# Patient Record
Sex: Male | Born: 1935 | Race: Black or African American | Hispanic: No | Marital: Single | Smoking: Former smoker
Health system: Southern US, Community
[De-identification: ages and names within clinical notes are randomized; demographics above are authoritative.]

## PROBLEM LIST (undated history)

## (undated) DIAGNOSIS — I739 Peripheral vascular disease, unspecified: Secondary | ICD-10-CM

## (undated) DIAGNOSIS — I509 Heart failure, unspecified: Secondary | ICD-10-CM

## (undated) DIAGNOSIS — I82409 Acute embolism and thrombosis of unspecified deep veins of unspecified lower extremity: Secondary | ICD-10-CM

## (undated) DIAGNOSIS — N390 Urinary tract infection, site not specified: Secondary | ICD-10-CM

## (undated) DIAGNOSIS — I4891 Unspecified atrial fibrillation: Secondary | ICD-10-CM

## (undated) DIAGNOSIS — D72829 Elevated white blood cell count, unspecified: Secondary | ICD-10-CM

## (undated) DIAGNOSIS — I10 Essential (primary) hypertension: Secondary | ICD-10-CM

## (undated) DIAGNOSIS — E119 Type 2 diabetes mellitus without complications: Secondary | ICD-10-CM

## (undated) DIAGNOSIS — C801 Malignant (primary) neoplasm, unspecified: Secondary | ICD-10-CM

## (undated) DIAGNOSIS — N400 Enlarged prostate without lower urinary tract symptoms: Secondary | ICD-10-CM

## (undated) HISTORY — DX: Peripheral vascular disease, unspecified: I73.9

## (undated) HISTORY — PX: LEG AMPUTATION: SHX1105

## (undated) HISTORY — DX: Acute embolism and thrombosis of unspecified deep veins of unspecified lower extremity: I82.409

## (undated) HISTORY — PX: CARDIAC SURGERY: SHX584

## (undated) HISTORY — PX: PR VEIN BYPASS GRAFT,AORTO-FEM-POP: 35551

## (undated) HISTORY — PX: CATARACT EXTRACTION: SUR2

## (undated) HISTORY — DX: Heart failure, unspecified: I50.9

---

## 1998-06-23 ENCOUNTER — Inpatient Hospital Stay (HOSPITAL_COMMUNITY): Admission: AD | Admit: 1998-06-23 | Discharge: 1998-06-30 | Payer: Self-pay | Admitting: Family Medicine

## 2002-07-17 ENCOUNTER — Inpatient Hospital Stay (HOSPITAL_COMMUNITY): Admission: EM | Admit: 2002-07-17 | Discharge: 2002-07-27 | Payer: Self-pay | Admitting: Emergency Medicine

## 2002-07-17 ENCOUNTER — Encounter: Payer: Self-pay | Admitting: Emergency Medicine

## 2002-07-19 ENCOUNTER — Encounter: Payer: Self-pay | Admitting: Cardiology

## 2002-07-19 ENCOUNTER — Encounter (INDEPENDENT_AMBULATORY_CARE_PROVIDER_SITE_OTHER): Payer: Self-pay | Admitting: Cardiology

## 2003-01-21 ENCOUNTER — Ambulatory Visit: Admission: RE | Admit: 2003-01-21 | Discharge: 2003-02-08 | Payer: Self-pay | Admitting: Radiation Oncology

## 2003-01-21 ENCOUNTER — Encounter: Payer: Self-pay | Admitting: Pulmonary Disease

## 2003-01-21 ENCOUNTER — Ambulatory Visit (HOSPITAL_COMMUNITY): Admission: RE | Admit: 2003-01-21 | Discharge: 2003-01-21 | Payer: Self-pay | Admitting: Pulmonary Disease

## 2003-02-20 ENCOUNTER — Ambulatory Visit: Admission: RE | Admit: 2003-02-20 | Discharge: 2003-05-21 | Payer: Self-pay | Admitting: Radiation Oncology

## 2003-03-12 ENCOUNTER — Emergency Department (HOSPITAL_COMMUNITY): Admission: EM | Admit: 2003-03-12 | Discharge: 2003-03-12 | Payer: Self-pay | Admitting: *Deleted

## 2003-03-19 ENCOUNTER — Encounter: Payer: Self-pay | Admitting: Emergency Medicine

## 2003-03-19 ENCOUNTER — Emergency Department (HOSPITAL_COMMUNITY): Admission: EM | Admit: 2003-03-19 | Discharge: 2003-03-20 | Payer: Self-pay | Admitting: Emergency Medicine

## 2004-02-14 ENCOUNTER — Ambulatory Visit (HOSPITAL_COMMUNITY): Admission: RE | Admit: 2004-02-14 | Discharge: 2004-02-14 | Payer: Self-pay | Admitting: Urology

## 2004-06-17 ENCOUNTER — Emergency Department (HOSPITAL_COMMUNITY): Admission: EM | Admit: 2004-06-17 | Discharge: 2004-06-17 | Payer: Self-pay | Admitting: Family Medicine

## 2004-12-22 ENCOUNTER — Inpatient Hospital Stay (HOSPITAL_COMMUNITY): Admission: EM | Admit: 2004-12-22 | Discharge: 2004-12-28 | Payer: Self-pay | Admitting: Family Medicine

## 2004-12-22 ENCOUNTER — Encounter (INDEPENDENT_AMBULATORY_CARE_PROVIDER_SITE_OTHER): Payer: Self-pay | Admitting: Cardiology

## 2005-07-26 ENCOUNTER — Inpatient Hospital Stay (HOSPITAL_COMMUNITY): Admission: EM | Admit: 2005-07-26 | Discharge: 2005-07-28 | Payer: Self-pay | Admitting: Emergency Medicine

## 2005-07-27 ENCOUNTER — Encounter (INDEPENDENT_AMBULATORY_CARE_PROVIDER_SITE_OTHER): Payer: Self-pay | Admitting: Cardiology

## 2005-08-12 ENCOUNTER — Inpatient Hospital Stay (HOSPITAL_COMMUNITY): Admission: EM | Admit: 2005-08-12 | Discharge: 2005-08-16 | Payer: Self-pay | Admitting: Emergency Medicine

## 2007-05-17 ENCOUNTER — Ambulatory Visit: Payer: Self-pay | Admitting: Vascular Surgery

## 2007-06-07 ENCOUNTER — Ambulatory Visit: Payer: Self-pay | Admitting: Vascular Surgery

## 2007-06-09 ENCOUNTER — Ambulatory Visit (HOSPITAL_COMMUNITY): Admission: RE | Admit: 2007-06-09 | Discharge: 2007-06-09 | Payer: Self-pay | Admitting: Vascular Surgery

## 2007-06-12 ENCOUNTER — Ambulatory Visit (HOSPITAL_COMMUNITY): Admission: RE | Admit: 2007-06-12 | Discharge: 2007-06-12 | Payer: Self-pay | Admitting: Vascular Surgery

## 2007-06-12 ENCOUNTER — Ambulatory Visit: Payer: Self-pay | Admitting: Vascular Surgery

## 2007-06-20 ENCOUNTER — Inpatient Hospital Stay (HOSPITAL_COMMUNITY): Admission: RE | Admit: 2007-06-20 | Discharge: 2007-07-07 | Payer: Self-pay | Admitting: Vascular Surgery

## 2007-06-20 ENCOUNTER — Encounter: Payer: Self-pay | Admitting: Vascular Surgery

## 2007-06-21 ENCOUNTER — Encounter: Payer: Self-pay | Admitting: Vascular Surgery

## 2007-07-26 ENCOUNTER — Ambulatory Visit: Payer: Self-pay | Admitting: Vascular Surgery

## 2007-07-28 ENCOUNTER — Inpatient Hospital Stay (HOSPITAL_COMMUNITY): Admission: AD | Admit: 2007-07-28 | Discharge: 2007-08-03 | Payer: Self-pay | Admitting: Vascular Surgery

## 2008-02-27 ENCOUNTER — Encounter: Admission: RE | Admit: 2008-02-27 | Discharge: 2008-02-27 | Payer: Self-pay | Admitting: Cardiology

## 2008-10-23 ENCOUNTER — Ambulatory Visit (HOSPITAL_COMMUNITY): Admission: RE | Admit: 2008-10-23 | Discharge: 2008-10-23 | Payer: Self-pay | Admitting: Urology

## 2010-06-21 ENCOUNTER — Encounter: Payer: Self-pay | Admitting: Vascular Surgery

## 2010-07-10 ENCOUNTER — Other Ambulatory Visit (HOSPITAL_COMMUNITY): Payer: Self-pay | Admitting: Cardiology

## 2010-08-05 ENCOUNTER — Ambulatory Visit (HOSPITAL_COMMUNITY)
Admission: RE | Admit: 2010-08-05 | Discharge: 2010-08-05 | Disposition: A | Discharge status: Home / Self Care | Payer: Medicare Other | Source: Ambulatory Visit | Attending: Cardiology | Admitting: Cardiology

## 2010-08-05 ENCOUNTER — Ambulatory Visit (HOSPITAL_COMMUNITY): Payer: Self-pay

## 2010-08-05 ENCOUNTER — Ambulatory Visit (HOSPITAL_COMMUNITY): Payer: Medicare Other

## 2010-08-05 DIAGNOSIS — R079 Chest pain, unspecified: Secondary | ICD-10-CM | POA: Insufficient documentation

## 2010-08-05 MED ORDER — TECHNETIUM TC 99M TETROFOSMIN IV KIT
10.0000 | PACK | Freq: Once | INTRAVENOUS | Status: AC | PRN
  Administered 2010-08-05: 10 via INTRAVENOUS

## 2010-08-05 MED ORDER — TECHNETIUM TC 99M TETROFOSMIN IV KIT
30.0000 | PACK | Freq: Once | INTRAVENOUS | Status: AC | PRN
  Administered 2010-08-05: 30 via INTRAVENOUS

## 2010-10-13 NOTE — Op Note (Signed)
Cristian Miller, Cristian Miller NO.:  [id number]   MEDICAL RECORD NO.:  [id number]          PATIENT TYPE:  INP   LOCATION:  2307                         FACILITY:  [facility]   PHYSICIAN:  Janetta Hora. Fields, MD  DATE OF BIRTH:  February 06, 1931   DATE OF PROCEDURE:  06/19/2007  DATE OF DISCHARGE:                               OPERATIVE REPORT   PROCEDURE:  1. Right axillary bifemoral bypass with 8 mm Dacron.  2. Bilateral iliofemoral thrombectomy.   PREOPERATIVE DIAGNOSIS:  Bilateral lower extremity ischemia with rest  pain.   POSTOPERATIVE DIAGNOSIS:  Bilateral lower extremity ischemia with rest  pain.   ANESTHESIA:  General.   ASSISTANT:  Jerold Coombe, P.A.   INDICATIONS:  The patient is a 77 year old male with history of  congestive heart failure who on recent arteriogram was found to have a  right common iliac artery thrombosis with thrombosis of the left and  right common femoral arteries and no named visible runoff in his lower  extremities.  He also has a history renal insufficiency.   OPERATIVE FINDINGS:  1. Bilateral chronic superficial femoral artery occlusions.  2. Large amount of thrombus, bilateral iliofemoral system.  3. A biphasic left profunda Doppler signal at conclusion of case.  4. Water-hammer right profunda femoris at conclusion of case.  5. No pedal Doppler signals at conclusion of case.   OPERATIVE DETAIL:  After obtaining informed consent, the patient taken  to the operating room.  The patient placed supine position on operating  table.  On induction of general anesthesia ,the patient went from his  chronic slower state of atrial fibrillation to rapid atrial fibrillation  with heart rate in the 200s.  He received cardioversion with 100 joules  and immediately converted back to sinus rhythm.  This point was decided  to press on for the rest and complete the start and initiate the  operation.   At this point the patient was prepped from the  chin to the toes.  Next a  right groin incision was made over the right common femoral artery.  Incision was made in longitudinal fashion, carried down through  subcutaneous tissues.  Common femoral artery was dissected free  circumferentially.  It was fairly large in caliber, approximately 14 mm  in diameter.  Profunda femoris and superficial femoral arteries were  also dissected free circumferentially.  Next attention was turned to the  left groin.  Similar left groin incision was made.  This carried down  through subcutaneous tissues down to the level of the left common  femoral artery.  Left common femoral artery was similar in appearance.  The left common femoral artery did have a pulse proximally right at the  level of the external iliac artery.  There was no femoral pulse on the  right side.  The profunda femoris and superficial femoral arteries  dissected free circumferentially.  There was also a large branch coming  posteriorly off of the common femoral which appeared to be a duplicated  profunda system.  Vessel loops placed around this.  Next a subcutaneous  tunnel was created  over the fascia connecting the left groin incision to  the right groin incision, umbilical tape placed through this.  Attention  was then turned to the right axilla.  An oblique incision was made in  the right infraclavicular region.  Incision was carried down through the  pectoralis major muscle and a portion of the pectoralis minor muscle was  taken down.  The axillary artery was identified and dissected free  circumferentially.  It was elevated up in the operative field.  Next a  subcutaneous tunnel was created along the anterior axillary line down to  the right groin.  This was done with an 8 mm tunneler.  There was some  difficulty passing the tunneler from the thorax to the lower abdomen  portion of the tunnel.  However, was some persistence I was able to  complete this tunnel.  8 mm Dacron graft was  then brought through this  tunnel.  The patient was then given 7000 units of intravenous heparin.  The axillary artery was clamped proximally and distally.  A 5 mm punch  was used to make approximately 8 mm hole on the inferolateral side of  the artery.  The Dacron graft was then sewn end of graft to side of  artery using running 5-0 Prolene suture.  Just prior to completion of  anastomosis it was fore bled, back bled and thoroughly flushed.  Anastomosis was secured, clamps released.  There was good pulsatile flow  into the axillary limb of the graft immediately.  Thrombin Gelfoam was  applied to the anastomosis.  Attention was then turned to the right  groin.  Longitudinal arteriotomy was made in the right common femoral  artery.  There was a large amount of thrombus within the artery.  This  was removed under direct vision.  Number 4 and 5 Fogarty catheters were  then passed up into the proximal external iliac and up into the common  iliac artery.  Large amount of thrombus was removed.  There was some  pulsatile inflow after passing the catheter.  This was then clamped  proximally.  Attention was then turned to the profunda, superficial  femoral arteries.  I was only able to pass the catheter approximately 10  cm down the superficial femoral artery.  Some thrombus was returned but  there was no backbleeding.  This was presumed to be chronically  occluded.  Number 4 Fogarty catheter was then passed down the profunda  femoris artery for approximately 20 cm.  A large amount of thrombus was  removed.  There was some backbleeding at this point.  Multiple passes  were made until no further thrombus was removed.  Next the 8 mm Dacron  graft was pulled taut to length and beveled.  This was then sewn end of  graft to side of the right common femoral artery using running 5-0  Prolene suture.  Just prior to completion anastomosis, this was fore  bled, back bled and thoroughly flushed.  Anastomosis  was secured, clamps  released there was pulsatile flow into the proximal superficial femoral  artery and profunda femoris arteries.  However, there was not biphasic  Doppler flow.  There was also no Doppler flow in the foot.  Attention  was then turned to the left groin.  The left common femoral artery was  controlled proximally with a Cooley clamp and profunda femoris,  superficial femoral arteries controlled with vessel loops distally.  The  artery was opened longitudinally.  There also was thrombus  was seen at  the level of the femoral bifurcation.  There was good arterial inflow,  but there was some thrombus at the distal external iliac artery.  So a  #4 Fogarty catheter was passed proximally up into the left iliac system.  All thrombotic material was removed and there was good arterial inflow.  This was then reclamped proximally.  Number 4 Fogarty catheter was then  used to try to thrombectomized the left superficial femoral artery.  Again on passing the catheter approximately 10 cm, it would pass no  further and the SFA was thought to be chronically occluded.  The  thrombectomy catheter was then passed down the profunda femoris artery  multiple times.  Some thrombus was removed.  I was able to pass the  catheter down approximately 20 cm.  There was good arterial back  bleeding from the profunda at this point.  A 8 mm Dacron graft was then  brought through the subcutaneous tunnel over the pubic bone connecting  the right groin to the left groin.  This was beveled and sewn end graft  to side of artery using a running 5-0 Prolene suture.  Prior to  completion of anastomosis, everything was fore bled, back bled and  thoroughly flushed.  Anastomosis was secured, clamps released.  The 8 mm  graft was clamped just above the level of anastomosis.  This was then  pulled taut to length over to the axillary-femoral bypass.  The axillary-  femoral bypass was clamped proximally and distally.  A  longitudinal  graftotomy was made in the axillary graft just above the level of the  right femoral anastomosis.  The femoral-femoral graft was then sewn end  of graft to side of axillary graft using a running 5-0 Prolene suture.  Just prior to completion of anastomosis, everything was fore bled, back  bled and thoroughly flushed.  Anastomosis was secured, clamps released.  There was good pulsatile flow in the graft immediately.  There were  biphasic to triphasic Doppler signals in the superficial and the  profunda femoris artery on the left side.  However, there were no  Doppler signals in the left foot.  The patient was requiring Neo-  Synephrine to support his blood pressure during the case.  He was also  fairly oliguric.  There was no named visible vessel below the knees on  either side.  So further thrombectomies or distal bypass were not  considered.  Next hemostasis was obtained in all incisions.  It should  be noted the patient was given an extra 5000 units of heparin during the  case.  Groins were closed in multiple layers of running 2-0 and 3-0  Vicryl  suture.  The skin closed with staples.  The axillary incision was closed  with a running 2-0 Vicryl followed by running 3-0 Vicryl suture and  staples in the skin.  The patient was taken to the intensive care unit  intubated still requiring some Neo-Synephrine but in stable condition.      Janetta Hora. Fields, MD  Electronically Signed     CEF/MEDQ  D:  06/20/2007  T:  06/21/2007  Job:  [id number]

## 2010-10-13 NOTE — Op Note (Signed)
Cristian Miller, Cristian NO.:  [id number]   MEDICAL RECORD NO.:  [id number]          PATIENT TYPE:  INP   LOCATION:  2306                         FACILITY:  [facility]   PHYSICIAN:  Janetta Hora. Fields, MD  DATE OF BIRTH:  June 27, 1930   DATE OF PROCEDURE:  06/29/2007  DATE OF DISCHARGE:                               OPERATIVE REPORT   PROCEDURE:  1. Revision of axillary-femoral anastomosis.  2. Irrigation, incision and drainage of left groin.   PREOPERATIVE DIAGNOSIS:  Anastomotic dehiscence axillary-femoral bypass.   POSTOPERATIVE DIAGNOSIS:  Anastomotic dehiscence axillary-femoral  bypass.   ANESTHESIA:  General.   ASSISTANT:  Jerold Coombe, P.A.   INDICATIONS:  The patient is a 77 year old male who underwent axillary  bifemoral bypass grafting 9 days ago.  He is noted to have bloody  drainage from his left groin earlier today.  He then developed a  pulsatile hematoma from his right axillary incision.  He is taken to the  operating room for exploration of this hematoma.   OPERATIVE FINDINGS:  1. Dehiscence of lateral aspect of axillary anastomosis.   OPERATIVE DETAIL:  After obtaining informed consent, the patient taken  the operating room.  The patient placed supine position on operating  table.  After induction of general anesthesia endotracheal intubation,  Foley catheter was placed.  Next, the patient was prepped from the chest  down to the thighs.  A preexisting staple line in the right  infraclavicular space was reopened.  The subcutaneous sutures were taken  down.  Hematoma was encountered.  This hematoma was evacuated.  There  was a large amount of bleeding from the axillary artery.  After some  tedious dissection, proximal and distal control was obtained with  vascular clamps.  The patient was given 5000 units of intravenous  heparin.  It was noted at this time that the graft had completely  dehisced from its lateral aspect.  The suture was  completely unraveled  and floating in the breeze.  The arterial integrity was of good quality.   The old axillary-femoral bypass was transected and clamped distally.  The old anastomosis was completely taken down.  A new 8 mm Dacron graft  was brought to the operative field.  This was then sewn end of graft to  side of artery using a running 5-0 Prolene suture.  At completion of  anastomosis everything was fore bled, back bled and thoroughly flushed.  Flow was then restored to the right upper extremity.  Next the graft was  clamped just above the anastomosis.  It was cut to length and prepared  for anastomosis to the lower limb of the axillary-femoral bypass.  The  peripheral DeBakey clamp was taken off the distal end of the graft and  there was no backbleeding.  Therefore, #4 Fogarty catheter was used to  thrombectomize this  limb of the graft multiple passes were made until a  large amount of thrombus was removed and there was pulsatile  backbleeding at this point.  This was then clamped distally.  Graft was  then cut to length and sewn  end-to-end to the old graft using a running  5-0 Prolene suture.  Just prior to completion anastomosis, this was fore  bled, back bled and thoroughly flushed.  Anastomosis was secured, clamps  released with good pulsatile flow in the graft immediately.  Both  anastomoses were then packed with thrombin Gelfoam.  Attention was then  turned to the left groin.  Since there had been bleeding from this, it  was decided that this needed to be explored as well.  The staples  removed and the incision in the left groin reopened.  There was some old  blood around the anastomosis but no obvious evidence of anastomotic  dehiscence or any bleeding from the anastomosis.  Apparently the blood  had tracked from the tunnel down to the left groin.  Left groin was  thoroughly irrigated and closed in multiple layers with running 2-0 and  3-0 Vicryl suture.  Skin was closed  with staples.  The infraclavicular  incision on the right side was then closed with multiple layers of 2-0  Vicryl followed by 3-0 Vicryl and staples in the skin.  Thrombin and  Gelfoam was removed before closing the wound.  The patient tolerated  procedure well and there were complications.  Instrument, sponge and  needle counts correct end of the case.  The patient taken to intensive  care unit in stable condition.  The patient remained intubated at the  time of transfer to the intensive care unit.      Janetta Hora. Fields, MD  Electronically Signed     CEF/MEDQ  D:  06/29/2007  T:  06/30/2007  Job:  [id number]

## 2010-10-13 NOTE — Discharge Summary (Signed)
Cristian Miller NO.:  [id number]   MEDICAL RECORD NO.:  [id number]          PATIENT TYPE:  INP   LOCATION:  6741                         FACILITY:  [facility]   PHYSICIAN:  Janetta Hora. Fields, MD  DATE OF BIRTH:  17-Feb-1931   DATE OF ADMISSION:  07/28/2007  DATE OF DISCHARGE:                               DISCHARGE SUMMARY   DATE OF DISCHARGE:  At this point, his anticipated date of discharge is  August 02, 2007 or August 03, 2007, unless the patient decides to proceed  with surgery.   ADMISSION DIAGNOSIS:  Severe peripheral vascular and aortoiliac  occlusive disease with rest pain, right lower extremity.   DISCHARGE/SECONDARY DIAGNOSES:  1. Severe peripheral vascular and aortoiliac occlusive disease with      rest pain, right lower extremity.  2. History of atrial fibrillation with rapid ventricular response      requiring emergent cardioversion during his hospitalization in      January of 2009.  3. History of chronic atrial fibrillation and is on anticoagulation      therapy with Coumadin.  4. History of pulmonary embolism.  5. History of congestive heart failure.  6. Hypertensive heart disease.  7. History of hypokalemia.  8. Hypercholesterolemia.  9. History of impaired glucose intolerance.  10.History of prostate cancer status post surgery and radiation.  11.History of noncompliance with medication therapy due to finances.  12.History of right leg deep vein thrombosis.  13.History of mild mitral valve regurgitation with ejection fraction      of 30-35%,  14.Enterobacter urinary tract infection last admission in January,      status post treatment with Ciprofloxacin.  15.History of thrombocytosis last admission with a HIT panel negative.  16.History of stage II lateral malleolus ulcer on the right during his      last admission in January 2009.   ALLERGIES:  CODEINE.   BRIEF HISTORY:  Cristian Miller is a 77 year old black male who is status  post right  axillary bifemoral artery bypass and bilateral iliofemoral  thrombectomy on June 19, 2007.  He had revision of the proximal  anastomosis after some bleeding on June 29, 2007.  Preoperatively, he  had also had an episode of unstable atrial fibrillation with rapid  ventricular response with a rate above 200 requiring emergent  cardioversion.  He has chronic atrial fibrillation flutter, and was rate  controlled on Cardizem and anticoagulated on Coumadin prior to  discharge.  He was hospitalized from June 20, 2007 and ultimately  discharged on July 07, 2007 to the [facility].  At  the time of discharge, he had fairly poor flow to both lower  extremities, but his rest pain had resolved.  On July 26, 2007, he  saw Dr. Fabienne Bruns in follow up and reported that over the last 48  hours he had increasing pain in both lower extremities, on the right  greater than on the left.  The pain was now continuous and worse at  night.  He had noninvasive vascular lab studies at the vascular vein  specialist's office which showed  no flow in his pedal pulses and also no  flow in his axillary femoral grafts.  Dr. Darrick Penna felt that he his  axillary right femoral graft had occluded most likely from competing  flow after re-establishment of flow down his iliac vessels since he now  had femoral pulses which were absent prior to the ax bifem.  Because he  was known to not have good flow down to his lower extremities, he was  felt at high risk for amputation.  He was scheduled for lower extremity  arteriogram to further evaluate his outflow.  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  His left foot at that time had  no evidence of gangrene.  Ultimately, Dr. Darrick Penna felt he would require a  right above-knee amputation but felt that as long as his pain could be  controlled, that he could  return to return to [facility];  however, the patient's pain ultimately became so severe that it was felt  he should be admitted for pain control.  Of note, during his last  admission we did have him evaluated by the psychiatrist, Dr. Antonietta Breach, to assess his competence.  Dr. Providence Crosby note indicated  that he felt the patient did not have capacity for informed consent and  would be at risk for passive, lethal self-neglect/living alone.  The  family was given information on obtaining power of attorney if desired.   HOSPITAL COURSE:  Cristian Miller was admitted to [facility] on  July 28, 2007 for pain management of rest pain, particularly in his  right lower extremity.  His Coumadin was placed on hold, and he was  started on Lovenox per pharmacy dosing.  Surgery was scheduled for July 31, 2007; however, the patient and family ultimately refused to sign  consent for surgery.  Of note, he had initially been scheduled for an  arteriogram to delineate his anatomy.  However, the procedure was not  performed secondary to increased pain in the patient's right leg with  contracture making the test technically not possible, and not felt still  indicated with rest pain, gangrene and contracture of his right leg.  Dr. Darrick Penna explained that he felt the patient was not a candidate for  revascularization to his right leg and has only options were pain  control versus amputation.  He favored a right above-knee amputation  since he had a knee contracture and this contracted knee would prevent  him from ever wearing a prosthetic.  The family and patient were still  reluctant to proceed.  Dr. Darrick Penna did arrange for his partner, Dr. Gretta Began, to provide a second opinion.  Later, the family also wanted to  consider transferring their father to [place] to be seen at a [place]  hospital in the [place].  We discussed having the patient  transferred back to [facility]  while the family and patient  were making a final decision.  However, they refused.  At this point,  the case manager was involved with the patient's disposition.  At the  time of dictation, the last decision was for the patient to be  transferred to the [facility] in [place]; however, upon speaking with  an accepting physician, they reported that the patient would first have  to be evaluated by the [facility] in [place], [place].  At  this  point, the case manager is working with the family and patient regarding  plans to either transfer the patient back to [facility] or  discharge the patient to the daughter's home.  We have attempted to  facilitate a physician-to-physician transfer to the [facility],  but currently they have not accepted the patient, and the family and  will be made aware that if they desire to proceed with [facility] care that they  may have to transfer the patient to the [facility] emergency  department after discharge from [facility].  Of course, their  other option would be to continue their hospitalization at [facility] with plans to proceed with a right knee amputation.  Overall,  Mr. Crichlow pain has been controlled with oxycodone; however, at least  once he has required IV morphine.  His Coumadin remains on hold.  However, he has been restarted on therapeutic Lovenox dosing.  If there  is a definite decision that he will refuse amputation, then we would  consider resuming his Coumadin; otherwise, he will remain on  subcutaneous Lovenox during his hospitalization at [facility].  Currently, Mr. Vanhise remains stable with his last vitals showing a  temperature of 98, pulse was 79, blood pressure 132/74, oxygen  saturation 96% on room air.  His labs as of July 31, 2007 showed a  sodium of 137, potassium 4.3, chloride 104, CO2 of 28, blood glucose  0.5, BUN of 21, creatinine 1.55 which is around his baseline,  calcium of  9.2.  His INR was 1.2.  Pro time was 15.4, white blood count was 8.1,  hemoglobin 12.6, hematocrit 38.2, platelet count 172.  His liver  function tests on admission were within normal limits.   DISPOSITION:  As previously outlined, the exact course of his  disposition is unknown at the time of this dictation.  The family and  patient are considering options including proceeding with right above-  knee amputation at [facility], discharging the patient back to  [facility] for pain management, and then having them call when  they wish to proceed with surgery, or discharging the patient to home  with his daughters and management of his pain control as an outpatient.  Will also provide them information on getting the patient evaluated at  the [facility] if they so desire.   DISCHARGE MEDICATIONS:  1. Lasix 40 mg p.o. daily.  2. Spironolactone 50 mg p.o. on Mondays, Wednesdays, Fridays and      Sundays.  3. Coreg 25 mg p.o. daily.  4. Cardizem CD 240 mg p.o. daily.  5. Potassium chloride 20 mEq p.o. daily.  6. Oxycodone 5 mg 1-2 tablets p.o. q.4h. p.r.n. pain.  7. Currently, he is on Lovenox 75 mg subcutaneously q.12h.  He will      remain on this while at [facility]; however, if he is      discharged and there is a final decision not to proceed with      surgery, he may resume his Coumadin 4 mg p.o. q.p.m. and as      directed.   DISCHARGE INSTRUCTIONS:  Continue a heart-healthy diet.  Increase his  activity slowly.  Avoid driving.  He may shower daily.  He should call  Dr. Darrick Penna if and when he decides to schedule surgery or there are any  new acute changes in his peripheral circulation.  If he returns to  [facility], they can continue routine monitoring of his  PT/INR.  If he is discharged to home with his daughters and they plan  not to proceed with surgery, then he should have his PT/INR checked  within 1 week of discharge  at either the patient's family physician's  office or his cardiologist to ensure that no change is needed in his  Coumadin dosing.  Additional addendums to this dictation will be added  as the disposition plans are clarified.      Cristian Miller, P.A.      Janetta Hora. Fields, MD  Electronically Signed    AWZ/MEDQ  D:  08/02/2007  T:  08/02/2007  Job:  [id number]   cc:   Mina Marble, M.D.  Eduardo Osier. Sharyn Lull, M.D.  Antonietta Breach, M.D.

## 2010-10-13 NOTE — Op Note (Signed)
NAMEHOSAM, Cristian Miller               ACCOUNT NO.:  [id number]   MEDICAL RECORD NO.:  [id number]          PATIENT TYPE:  AMB   LOCATION:  SDS                          FACILITY:  [facility]   PHYSICIAN:  Di Kindle. Edilia Bo, M.D.DATE OF BIRTH:  03/17/1931   DATE OF PROCEDURE:  06/12/2007  DATE OF DISCHARGE:                               OPERATIVE REPORT   PREOPERATIVE DIAGNOSIS:  Aortoiliac and infrainguinal arterial occlusive  disease   POSTOPERATIVE DIAGNOSIS:  Aortoiliac and infrainguinal arterial  occlusive disease.   PROCEDURE:  1. Aortogram.  2. Bilateral iliac arteriogram.  3. Bilateral lower extremity arteriography.  4. Arch aortogram via a left brachial approach.   TECHNIQUE:  The patient was taken to the PV lab and sedated with 0.5 mg  of Versed and 25 mcg of fentanyl.  The left arm was prepped and draped  in usual sterile fashion.  After the skin was anesthetized with 1%  lidocaine, the left brachial artery was cannulated and a guidewire  introduced into the artery.  The 5-French sheath was then introduced  over the wire.  The patient then received 300 mcg of nitroglycerin  through the sheath and also 2000 units of IV heparin through the sheath.  The sheath was then sutured in place.  A Wholey wire was then advanced  into the aortic arch.  I then advanced a pigtail catheter over this  wire.  This was exchanged for a J-wire which was passed down into the  descending thoracic aorta into the infrarenal aorta.  The pigtail  catheter was advanced to the L1 vertebral body; flush aortogram was  obtained.  The catheter was then repositioned above the aortic  bifurcation, and oblique iliac projection was obtained.  Spot films were  then obtained of the right groin, the right thigh, and the right knee.  On the way out, the catheter was positioned in the ascending aortic arch  and aortogram obtained.   FINDINGS:  The patient is noted have very sluggish flow.  The renal  arteries  are patent bilaterally with single renal arteries.  The  infrarenal aorta is patent.  The common iliac arteries are patent  bilaterally as are the hypogastric arteries.  On the right side, the  external iliac artery is occluded at its origin.  There is some  reconstitution of the profunda, but the SFA, popliteal, and proximal  tibial vessels are all occluded.   On the left side, the common iliac, external iliac are patent.  The  distal common femoral artery is occluded as is the superficial femoral  artery and popliteal artery on the left.  Likewise, the proximal tibial  vessels are occluded.  There is reconstitution of some branches of the  deep femoral artery on the left.  Arch aortogram reveals a patent arch  with patent innominate artery, right subclavian artery, and right common  carotid artery.  The left common carotid artery and left subclavian  artery are also patent.   CONCLUSIONS:  1. Occluded right external iliac artery.  2. Bilateral superficial femoral artery occlusions with no named  vessels distally visualized.      Di Kindle. Edilia Bo, M.D.  Electronically Signed     CSD/MEDQ  D:  06/12/2007  T:  06/12/2007  Job:  [id number]   cc:   Mina Marble, M.D.

## 2010-10-13 NOTE — Consult Note (Signed)
NAMENIXXON, FARIA NO.:  [id number]   MEDICAL RECORD NO.:  [id number]           PATIENT TYPE:   LOCATION:                                 FACILITY:   PHYSICIAN:  Antonietta Breach, M.D.  DATE OF BIRTH:  November 01, 1930   DATE OF CONSULTATION:  07/26/2007  DATE OF DISCHARGE:                                 CONSULTATION   REQUESTING PHYSICIAN:  Fabienne Bruns, MD   REASON FOR CONSULTATION:  Mental status changes.   HISTORY OF PRESENT ILLNESS:  Mr. Adolf Ormiston is a 77 year old male  admitted to the [facility] on June 12, 2007.   Mr. Nickson has been experiencing greater than 2 weeks of progressive  impairment in judgment and confusion.  He has had marked worsening of  his symptoms at night.  He is not harming himself or others.  He has no  hallucinations or delusions.  His judgment is impaired.  He is also  having some memory difficulty.   PAST PSYCHIATRIC HISTORY:  In review of the past medical record, there  is a report of a head CT scan in February 2004 without contrast that  showed mild atrophy.   In review of the discharge summary in March 2004, it was noted that the  patient had been placed on Remeron 15 mg nightly and Ambien p.r.n. for  sleep.  He had experienced some confusion after starting the Remeron.  The Remeron was discontinued, and Xanax was started at 0.25 mg q.a.m.  and q.p.m. p.r.n.   FAMILY PSYCHIATRIC HISTORY:  None known.   SOCIAL HISTORY:  Marital status, divorced.  Occupation, retired.  The  patient has been living at home with plans for skilled nursing care due  to his general medical problems.   PAST MEDICAL HISTORY:  Aortoiliac occlusive disease, bilateral lower  extremity ischemia, history of atrial fibrillation with Coumadin chronic  therapy, history of pulmonary embolism, history of congestive heart  failure, hypertensive heart disease, hypercholesterolemia, history of  prostate cancer with surgery and radiation, mitral  valve regurgitation,  and gangrene in right toes.   MEDICATIONS:  MAR is reviewed.   ALLERGIES:  The patient has an allergy to CODEINE.   LABORATORY DATA:  WBC 14.7, hemoglobin 10.3, and platelet count 305.  BUN 18 and creatinine 1.2.   REVIEW OF SYSTEM:  Constitutional, head, eyes, ears, nose, throat,  mouth, neurologic, psychiatric, cardiovascular, respiratory,  gastrointestinal, genitourinary, skin, musculoskeletal, hematologic  lymphatic, endocrine, and metabolic all are unremarkable.   PHYSICAL EXAMINATION:  VITAL SIGNS:  Temperature 96.5, pulse 113,  respiratory rate 18, blood pressure 132/77, and O2 saturation on room  air 96%.  GENERAL APPEARANCE:  Mr. Yasui is an elderly male, partially reclined  in his hospital bed, with no abnormal involuntary movements.   MENTAL STATUS EXAM:  Mr. Hari is alert.  His attention span is mildly  decreased.  His eye contact is intact.  He is oriented to all spheres.  His mood is slightly anxious.  His affect is slightly anxious.  He has  difficulty recalling words, although he did recall 2/3 visual objects.  His speech is soft.  No dysarthria.  Thought process is coherent;  however, there is some alogia.  His fund of knowledge and intelligence  are below that of his estimated premorbid baseline.  Thought content, no  thoughts of harming himself, no thoughts of harming others, no  delusions, and no hallucinations.  His insight is poor.  His judgment is  also impaired.  Please see below.   ASSESSMENT:  AXIS I:  294.9 unspecified persistent mental disorder, not  otherwise specified (rule out dementia, not otherwise specified).  AXIS II:  Deferred.  Axis AXIS III:  See past medical history.  AXIS IV:  Primary support group, general medical.  AXIS V:  30.   Mr. Cure does demonstrate a deficit in being able to differentiate  between his options and their associated risks versus benefits when it  comes to his living environment and  necessary medical care.  He also has  an inability to appreciate the risks of his general medical problems.  His reasoning is also impaired.  He does not have the capacity for  informed consent.   He would be at risk for passive, yet lethal self-neglect, if he were to  live alone.   Regarding his organic workup, further tests may be of little yield;  however, if not done, would consider ordering B12, folic acid, TSH, and  an RPR.   Would consider starting Namenda and/or Aricept, if his memory function  is not returning to baseline.      Antonietta Breach, M.D.  Electronically Signed     JW/MEDQ  D:  10/02/2007  T:  10/03/2007  Job:  [id number]

## 2010-10-13 NOTE — Procedures (Signed)
AORTA-ILIAC DUPLEX EVALUATION   INDICATION:  Severe pain in the right foot.   HISTORY:  Diabetes:  No.  Cardiac:  Atrial fibrillation.  Hypertension:  Yes.  Smoking:  Quit over 30 years ago.  Previous Surgery:  No.               SINGLE LEVEL ARTERIAL EXAM                              RIGHT                  LEFT  Brachial:                  130                    130  Anterior tibial:           Inaudible              Inaudible  Posterior tibial:          Inaudible              Inaudible  Peroneal:  Ankle/brachial index:  Previous ABI/date:   AORTA-ILIAC DUPLEX EXAM  Aorta - Proximal     195 Cm/s  Aorta - Mid  Aorta - Distal       <20 cm/s   RIGHT                                   LEFT                    CIA-PROXIMAL                    CIA-DISTAL                    HYPOGASTRIC  Occluded          EIA-PROXIMAL          132 Cm/s                    EIA-MID                    EIA-DISTAL   IMPRESSION:  1. Right common femoral artery is occluded.  2. Right external iliac artery is occluded.  3. Left common femoral artery peak systolic velocity 15 cm/s.  4. Severe aortoiliac occlusive disease.   ___________________________________________  Janetta Hora. Fields, MD   MC/MEDQ  D:  05/17/2007  T:  05/18/2007  Job:  [id number]

## 2010-10-13 NOTE — Assessment & Plan Note (Signed)
OFFICE VISIT   Cristian Miller, Cristian Miller  DOB:  22-Dec-1930                                       07/26/2007  [id number]#:[id number]   The patient returned for followup today.  He underwent a right axillary  bifemoral bypass and bilateral iliofemoral thrombectomies on January 19.  He had revision of the proximal anastomosis after some bleeding on  January 29.  He was then discharged to [facility].  At  the time of discharge, he had fairly poor flow in both lower  extremities, but his rest pain had resolved.  Over the last 48 hours, he  has had increasing pain in both lower extremities, right greater than  left.  He is in continuous pain, which is worse at night.  Also, when he  was discharged from the hospital, he had dry gangrene of his right toes.   PAST MEDICAL HISTORY:  Remarkable for prior right leg DVT and pulmonary  embolism.  He has a history of mitral valve regurgitation and low  ejection fraction.  He has had multiple admissions for congestive heart  failure in the past.  He also has a history of prostate cancer and  radiation.  History of chronic atrial fibrillation.   PAST SURGICAL HISTORY:  As above.   MEDICATIONS:  Lasix 40 mg once a day.  Spironolactone 50 mg on Monday,  Wednesday, Friday, and Sunday.  Coreg 25 mg once a day.  Potassium  chloride 20 mEq once a day.  Cardizem CD 240 mg once a day.  Oxycodone.   He has an allergy to codeine, which makes him paranoid.   SOCIAL HISTORY:  He is single.  He has 8 children.  He is retired  Corporate treasurer.  He is a former smoker and he does not consume  alcohol regularly, although he was a heavy drinker in the past.   PHYSICAL EXAM:  Blood pressure 101/68, heart rate is 49 and regular.  HEENT is unremarkable.  Chest is clear to auscultation.  Cardiac exam is  regular rate and rhythm.  Abdomen is soft and nontender.  Extremities,  his right axillary incision is well-healed.  There is no  palpable pulse  or audible Doppler flow in the axillary portion of the graft.  He does  have 2+ femoral pulses bilaterally and his incisions in the groin are  healing well.  He has no palpable popliteal or pedal pulses.   He had a noninvasive vascular study today, which showed no flow in his  pedal vessels.  There was also no flow in his axillary femoral grafts.   The patient has occluded his axillary bifemoral graft.  This most likely  is from competing flow after reestablishment of flow down his iliac  vessels.  Unfortunately, he still does not have very good flow down to  his lower extremities.  He is still at high risk of amputation.  His  rest pain has become worse, suggesting that this may have progressed.  He still has dry gangrene of toes 2 through 4 on the right foot, which  has not progressed.  The left foot does not have any evidence of  gangrene currently.  I believe the best option for him is a repeat  arteriogram to see what the status of his vessels is now after his  thrombectomy and axillary bifemoral bypass graft.  If he has native flow  down his iliac system, I would not thrombectomize the ax-fem graft, but  try to do an outflow procedure if possible.  His arteriogram is  scheduled for Friday, February 27.  He will stop his Coumadin today.  Of  note, he has also had a fairly significant constipation and we will  place him on some magnesium citrate today to try to resolve this.   Janetta Hora. Fields, MD  Electronically Signed   CEF/MEDQ  D:  07/27/2007  T:  07/27/2007  Job:  [id number]

## 2010-10-13 NOTE — Assessment & Plan Note (Signed)
OFFICE VISIT   Cristian Miller, Cristian Miller  DOB:  May 16, 1931                                       06/07/2007  [id number]#:[id number]   The patient returns for followup today.  He was originally scheduled for  a CT angiogram to evaluate him for aortoiliac occlusive disease since he  was on Coumadin.  However, he also has renal insufficiency and, despite  2 attempts to try to do the CT angio with and without hydration, his  renal insufficiency was still too high to have the CT scan performed.  He returns to the office today still complaining of bilateral rest pain  in his feet.  He has no open sores on the feet.  He has 2+ brachial  pulses bilaterally.  Feet are dry and scaly in appearance, but no open  wounds.   Blood pressure today was 164/120, temperature 99, pulse 100 and  irregular.  He has a history of atrial fibrillation.   Most likely, the patient is going to need aortobifemoral bypass grafting  for reconstruction.  He does have a history of some congestive heart  failure and valvular disease.  This has been followed by Dr. Allyson Sabal in  the past.  I scheduled him for a left transbrachial aortogram with lower  extremity runoff for January 9.  We will also see if we can have  [facility] or Dr. Allyson Sabal evaluate him during this outpatient  hospital stay for his arteriogram.  If his cardiac status is reasonable,  we will schedule him for an aortobifemoral bypass in the near future.   Janetta Hora. Fields, MD  Electronically Signed   CEF/MEDQ  D:  06/07/2007  T:  06/08/2007  Job:  [id number]

## 2010-10-13 NOTE — Discharge Summary (Signed)
Cristian Miller, Cristian Miller NO.:  [id number]   MEDICAL RECORD NO.:  [id number]          PATIENT TYPE:  INP   LOCATION:  2001                         FACILITY:  [facility]   PHYSICIAN:  Janetta Hora. Fields, MD  DATE OF BIRTH:  09-16-1930   DATE OF ADMISSION:  06/20/2007  DATE OF DISCHARGE:  07/07/2007                               DISCHARGE SUMMARY   ADDENDUM   Please see Job No. [id number] for details outlining Cristian Miller' admission  and discharge diagnoses, procedures, brief history and hospital course  through July 05, 2007.   ADDITIONAL DISCHARGE DIAGNOSES:  Stage II, lateral malleolus ulcer,  right.   HOSPITAL COURSE:  As mentioned in the previous discharge summary, the  plan was for Cristian Miller to eventually go to a skilled nursing facility.  At that time, the clinical social worker was still working on  arrangements.  Ultimately, a bed was available at [facility]  on July 07, 2007.  He was felt stable for discharge at that time.  However, in the meantime, it was noted that he had a stage II, lateral  malleolus ulcer on the right which was circular with a yellow tissue bed  with no surrounding erythema and measured approximately 7-8 mm.  This  was treated with hydrogel and gauze dressing daily.  We also ordered  some moisturizing lotion to his bilateral lower extremities daily for  skin dryness.  Also, as mentioned, he was being treated for a urinary  tract infection.  He has been on ciprofloxacin.  Final cultures are  still pending, but preliminary cultures were growing gram-negative rods.  I will ask our office to follow up final culture results on Monday,  February 9, and let [facility] know the results if it is not  sensitive to Cipro.  At discharge, his grafts remain patent.  His right  foot gangrene remains stable.  He has positive movement in both feet.  Pain is controlled on oral medication.  His heart rate has overall been  well-controlled on daily Cardizem.  He has had some intermittent  transient elevations in the 120s with activity, but at rest, he has been  in the 70-90 range.  In addition, we did obtain a psychiatric evaluation  by Dr. Jeanie Sewer to assess his competence.  Per his initial note, it was  felt that the patient did not have capacity for informed consent and  would be at risk for passive, lethal, self-neglect/living alone.  His  family is interested in obtaining power of attorney and the social  worker did meet with them and explained that they would have to contact  a lawyer to begin this process if desired.   At discharge, his INR was therapeutic at 2.5.  Cristian Miller was  discharged to a skilled nursing facility on July 07, 2007, in stable  condition.   DISCHARGE MEDICATIONS:  As previously dictated.   SPECIAL INSTRUCTIONS:  Discharge instructions are as previously dictated  with the exception that we have now asked the nursing facility to draw a  PT/INR on July 10, 2007, since he  is on Cipro to ensure that he does  not become supratherapeutic.  We also ordered wound care to his right  ankle ulcer, specifically hydrogel to the wound and wrapping with a dry  gauze dressing such as Kerlix since it is changed daily and as needed  and to continue  moisturizing lotion to his bilateral lower extremities daily and as  needed.  Again, his remaining staples should be removed on July 13, 2007, by the nursing staff and should call [phone], if they would  prefer this be done at the vascular and vein specialist's office.      Jerold Coombe, P.A.      Janetta Hora. Fields, MD  Electronically Signed    AWZ/MEDQ  D:  07/07/2007  T:  07/07/2007  Job:  [id number]   cc:   Mina Marble, M.D.  Eduardo Osier. Sharyn Lull, M.D.  Antonietta Breach, M.D.

## 2010-10-13 NOTE — Assessment & Plan Note (Signed)
OFFICE VISIT   Cristian Miller, Cristian Miller  DOB:  06-Apr-1931                                       05/17/2007  [id number]#:[id number]   The patient is a 77 year old male with atherosclerotic risk factors of  hypertension and remote smoking.  He also has a history of coronary  artery disease with congestive heart failure.  He is referred by Dr.  Petra Kuba for a 3-week history of right foot pain.  The patient stated  that he has had pain in the right foot before and he has had an ingrown  toenail for approximately 3 years.  This has had exacerbations and  remissions.  He does not really describe claudication symptoms.  He  states that the foot hurts continuously and is improved somewhat by  dangling it out of the bed at night time or sitting in the recliner.  He  has not had any non-healing wounds thus far.   PAST MEDICAL HISTORY:  Remarkable for a prior right leg DVT and  pulmonary embolism.  He has a previous cardiac history of mild mitral  valve regurgitation and an ejection fraction of 30-35% in February of  2007.  He has had admissions for congestive heart failure in the past.  He also has a history of prostate cancer and radiation.   PAST SURGICAL HISTORY:  Unremarkable.   MEDICATIONS:  Warfarin 4 mg once a day.  Carbitol 25 mg once a day.  Spironolactone 50 mg Monday, Wednesday, Friday, and Sunday.  Lasix 40 mg  every day.  Cardizem 240 mg Tuesday, Thursday, and Saturday.  Klor-Con  20 mEq twice a day.  Oxycodone 1 to 2 every 4 to 6 hours for pain.   SOCIAL HISTORY:  He is single.  Has 8 children.  He is a retired  Corporate treasurer.  Smoking history:  He quit 30 years ago.  He does  not consume alcohol on a regular basis.  He was a heavy drinker in the  remote past.   REVIEW OF SYSTEMS:  He has some dyspnea with lying flat.  He also has  some shortness of breath when sitting.  He also has a history of atrial  fibrillation.  This has been followed in the past,  he says, by Dr.  Allyson Sabal.  He denies history of asthma, wheezing, or home oxygen use.  He  denies history of GI bleeding or renal dysfunction.  He does have a  history of prostate cancer in the past, but this has been inactive.  He  has no history of TIA, stroke, or slurred speech.  He has no history of  syncope, dizziness, or bleeding problems.  However, he is on Coumadin.  He has had bilateral cataract removal.   PHYSICAL EXAM:  Blood pressure is 120/80 in the left arm, heart rate is  60 and irregular.  HEENT is unremarkable.  Neck has 2+ carotid pulses  without bruit.  Chest is clear to auscultation.  Cardiac exam is regular  rate and rhythm.  Abdomen is soft and nontender.  No masses and normal  bowel sounds.  He has 2+ brachial and radial pulses bilaterally.  He has  a 2+ left femoral with absent right femoral and absent popliteal and  pedal pulses bilaterally.  There are no ulcerations on the feet.  The  right foot is slightly more edematous  than the left.  He has scaly  thickened skin in the calves bilaterally.  He has diffuse varicose veins  in the right calf.  He is allergic to codeine, which makes him paranoid.  He had a noninvasive arterial vascular lab exam today.  It showed  occlusion of his right common femoral artery with high-grade stenosis  above the level of the left common femoral artery suggestive of severe  aortoiliac disease.  He had inaudible Doppler signals in the anterior  and posterior tibial arteries bilaterally.   The patient has significant aortoiliac occlusive disease.  He probably  also has multilevel disease in his lower extremities as well.  We will  schedule him for a CT angiogram with runoff to avoid stopping his  Coumadin, at least for now.  If this shows significant inflow disease,  this may be amenable to percutaneous intervention.  However, I suspect  he is going to need open revascularization.  If this is required, we  will have to have him reviewed  by his cardiologist, as far as his  ejection fraction and coronary artery disease is concerned.  I will  review his CT angiogram within the next few days after it is done.   Janetta Hora. Fields, MD  Electronically Signed   CEF/MEDQ  D:  05/18/2007  T:  05/18/2007  Job:  [id number]   cc:   Mina Marble, M.D.

## 2010-10-13 NOTE — Discharge Summary (Signed)
NAMEOTHAL, KUBITZ NO.:  [id number]   MEDICAL RECORD NO.:  [id number]          PATIENT TYPE:  INP   LOCATION:  2001                         FACILITY:  [facility]   PHYSICIAN:  Janetta Hora. Fields, MD  DATE OF BIRTH:  December 10, 1930   DATE OF ADMISSION:  06/20/2007  DATE OF DISCHARGE:                               DISCHARGE SUMMARY   ADMISSION DIAGNOSIS:  Bilateral lower extremity ischemia with rest pain.   DISCHARGE/SECONDARY DIAGNOSES:  1. Bilateral lower extremity ischemia with rest pain.  2. Aortoiliac occlusive disease.  3. Preoperative atrial fibrillation with rapid ventricular response in      the 200s requiring cardioversion.  4. Chronic atrial fibrillation on anticoagulation therapy with      Coumadin.  5. History of congestive heart failure.  6. History of pulmonary embolism.  7. Hypertensive heart disease.  8. History of hypokalemia.  9. Hypercholesterolemia.  10.History of impaired glucose intolerance.  11.History of prostate cancer with history of surgery and radiation.  12.History of noncompliance with medication therapy due to finances.  13.History of right leg deep vein thrombosis.  14.History of mild mitral valve regurgitation, ejection fraction 30-      35%.  15.Acute blood loss anemia secondary to graft disruption at the      axillary artery requiring emergent repair.  16.Thrombocytopenia, improved (HIT profile negative).  17.Leukocytosis, believed secondary to urinary tract infection with      final cultures pending at the time of this dictation.  18.Dry gangrene, right toes.  19.Mild confusion, improved (urinary tract infection possible      contributing factor).  20.Atrial flutter with currently controlled rate.  21.Allergy to codeine.   PROCEDURES:  1. On June 20, 2007, right axillary bifemoral artery bypass using 8-      mm Dacron graft, bilateral iliofemoral thrombectomy by Dr. Fabienne Bruns.  2. June 29, 2007, revision of  right axillary femoral anastomosis,      exploration of left groin by Dr. Fabienne Bruns.   BRIEF HISTORY:  Mr. Dauzat is a 77 year old black male who has been  followed by Dr. Fabienne Bruns. He was referred by Dr. Petra Kuba with a  three-week history of right foot pain. He had had pain in his right foot  before and also history of ingrown toenail for approximately three  years. He had exacerbations and remissions of this pain. He did not  really describe claudication-type symptoms but said his foot hurts  continuously and is improved somewhat with dangling out of the bed at  night or with sitting in the recliner. He has had no nonhealing wounds  when seen by Dr. Darrick Penna in mid December. He has had lower extremity  Doppler studies performed on May 17, 2007. This showed the right  common artery and right external iliac artery were occluded. The left  common femoral artery peak systolic velocity was 15 cm per second for  severe aorto-occlusive disease. He was scheduled for CTA with run off to  avoid stopping his Coumadin, but it was felt that he would most likely  require revascularization.  However, it was noted he had renal  insufficiency, and despite two attempts to do a CTA with and without  hydration, it was felt his renal insufficiency was still too high to  have a CT scan performed. He followed up with Dr. Darrick Penna on June 07, 2007 complaining of bilateral rest pain, still with no open sores. He  did undergo aortogram with bilateral iliac arteriograms and lower  extremity arteriography and arch aortogram via the left brachial  approach. This was done on January 12 by Dr. Waverly Ferrari. This  showed an occluded right external iliac artery and bilateral superficial  femoral artery occlusions with no named vessels distally visualized.  Based on this finding and due to his cardiac history, it was felt it  would be best for him to undergo a right axillary femoral bypass   grafting.   HOSPITAL COURSE:  Mr. Flagg was admitted to [facility]  June 20, 2007. He underwent the previously mentioned procedure. Of  note, prior to making incisions but after induction of general  anesthesia, the patient's chronic atrial fibrillation went from a rather  controlled rate to rapid rate in the 200s. He received cardioversion  with 100 joules, and rate was then controlled. At that time, Dr. Darrick Penna  made a decision to proceed with surgery. He was started on a Cardizem  drip. Postoperatively, his axillary femoral bypass grafts were patent.  He did have postoperative ABIs and Doppler studies showing all pedal  wave forms damp and monophasic except for left peroneal artery which was  inaudible.  His right ABI was 0.47 (AT), on the left 0.59 (AT). On  postoperative day #1, his Coumadin was resumed as well as IV heparin to  use a bridge until his INR was therapeutic. Physical therapy was  consulted for mobility. They initially felt he would require at least  short-term skilled nursing care, and the clinical social worker was  consulted to begin bed searches. In the meantime, it was noted that his  platelet count was decreased to 115. HIT panel was sent that was  ultimately negative. However, it had already been discontinued prior to  the results due to fast rising INR which became therapeutic. In regards  to his feet, temperature remained decreased/slightly cool. His left leg  had no pain. However, he did have intermittent pain in his right foot.  However, exam remained stable. He did develop some dry gangrene of his  right toes, but again pain was controlled, and his graft remained  patent. It was felt we could continue to monitor this for the time  being. He did develop some leukocytosis with white count around 15,000,  which was noted around January 23. We found no significant source and  did not feel that the gangrene of his right toes did not appear  infected.  However, he did have some mild serous drainage from his groin  incisions, and Keflex was started as a precaution to see if this by  chance was a source. A urinalysis was done which showed only small  leukocytes but no nitrites. Prior chest x-ray had shown no active  pulmonary disease, and his lung sounds were clear. His heart rate was  maintaining atrial fibrillation but in a controlled rate. He thought he  was nearing ready for discharge to a nursing facility. However, we were  monitoring his white counts to ensure that nothing from his surgery  appeared infected. Unfortunately, on June 29, 2007, he developed  sudden  onset of bright red blood from his left groin incision and was  noted to have a pulsatile hematoma of his right axillary incision. At  that time, his says his INR was at 2.3. He was treated with 4 units of  fresh frozen plasma. He was taken to the operating room that day for  exploration. This showed dehiscence of the lateral aspect of the  axillary anastomosis. He received multiple transfusions intraoperative,  and the graft was revised. Dr. Darrick Penna also explored the left groin since  that was where initial bleeding was found. However, it was felt this had  mostly tracked from his axillary site, as there was no disruption of his  left femoral anastomosis noted. Following this surgery, he was  transferred to the surgical intensive care unit overnight. He required  further transfusion on the following day, but following that, his  hemoglobin and hematocrit stabilized. He did have a residual small right  hematoma along his right axillary incision and shoulder region. We  watched this for several days, and there has been no significant change,  and again his blood counts have remained stable. Following surgery, he  was back on a Cardizem drip. This was discontinued and was going to stay  on his oral Cardizem regimen which was three times weekly. However, he  again developed  atrial fibrillation with rapid ventricular response with  a rate in the 140s. Again, Cardizem drip was resumed, and his oral  Cardizem was increased to a daily regimen, and after 24-hour overlap,  his heart rate was improved, and the Cardizem has been discontinued. At  the time of dictation, his rate remains around 70-100 in atrial  fibrillation on a daily Cardizem regimen. Other vitals show blood  pressure of 107/66. He has been afebrile. Room air oxygen saturation has  been 93-100%. Note, his white count was elevated following his second  surgery up to almost 21.4 thousand. It was unclear whether this was  reactive due to his multiple blood transfusions. We rechecked this in a  few days, and it was still at 19. At this point, we checked his urine  for bacteria, and his urinalysis has shown too many white blood cells to  count and positive nitrites. With the 19,000 WBCs, he has already been  started at [facility] for 24 hours, and it noted a decrease to 14.7. However,  with his positive UA, this was changed to ciprofloxacin for 7 days. We  anticipate that we will have a final culture result within the next 24  hours and will adjust his antibiotic regimen as needed depending on  susceptibility. Other than the previously mentioned issues following his  second surgery, he made steady progress. Physical therapy continued to  work with him, and again it was felt he would require at least short-  term skilled nursing facility care. He had exhibited some mild  confusion, both after his initial surgery and his second surgery, but  was seemed to be clearing. In fact, he was alert and oriented at the  time of this dictation. It was unclear whether this may have been due to  his urinary tract infection. However, due to family concern,  specifically his daughters, we have asked psychiatry to do an evaluation  to evaluate his competency. At this point, his daughters feel that they  may be able to manage  his fiances more appropriately, but again, we  waited for psychiatry to give [place] their input.   As of July 05, 2007, Mr. Garris continued to make steady progress.  Vitals continued to remain stable. His white count had been trending  down. Hemoglobin and hematocrit had been stable around 10 and 31 for  several days, platelet count 305 showing resolution of his  thrombocytopenia. His INR has been stable at 2.4. He has been voiding.  Bowels are working appropriately with use of p.r.n. laxative or stool  softener. His renal function has been stable, most recently at 1.2.  Sodium 135, potassium 4.2, chloride 101, CO2 27, blood glucose 122, BUN  18, calcium 8.8. There has been no significant change in the appearance  of his right foot. Again, there is evidence of dry gangrene of his right  foot but without drainage. He still has intermittent pain but nothing  chronic, and his pain has been controlled on oxycodone p.r.n. His right  axillary femoral bypass remains patent. He is maintaining atrial flutter  with a rate up to 100. His incisions show no sign of infection, and  again, there has been no change in the size of his small right chest  hematoma. Again, we have asked psychiatry to perform an comprehensive  evaluation prior to discharge which is still pending. We have asked  clinical social work to [employer] in anticipation for discharge to  skilled nursing facility within the next 48 hours. At this point, we  anticipate his discharge to be on February 5 or July 07, 2007, if  there is no significant change in his status and currently remains  hemodynamically stable.   DISCHARGE MEDICATIONS:  1. Lasix 40 mg p.o. daily.  2. Spironolactone 50 mg p.o. on Mondays, Wednesdays, Fridays and      Sundays.  3. Coreg 25 mg daily.  4. Potassium chloride 20 mEq p.o. daily.  5. Cardizem CD 240 mg daily.  6. Oxycodone 5 mg 1-2 tablets p.o. q.4h. p.r.n. pain.  7. Ciprofloxacin 500 mg p.o.  b.i.d. to continue through February 10,      20 09. Again, we will make adjustments in his anti-infective regimen      if needed based on his final urine culture results.  8. At this point, we are planning on Coumadin 4 mg p.o. q.p.m. and as      directed by INR results.   DISCHARGE INSTRUCTIONS:  Continue a heart healthy diet and increase his  activity slowly. Ambulate with assistance. He has been utilizing a  walker and 3-in-1. He should avoid driving or heaving lifting at this  time until stronger from a therapy standpoint. Incision should be  cleaned daily with soap and water. Dr. Darrick Penna should be notified if he  develops unexplained fevers greater than 101, increased pain in his  lower extremities, redness or purulent drainage from his incision site,  or separation of incisions or increased swelling from incision site. Dr.  Darrick Penna will see him in the [facility] office on  July 26, 2007 at 11:30 a.m. and will have Doppler studies as well at  this appointment. We plan to remove his right groin staples prior to  discharge. However, his right chest and left groin staples should be  removed by one of the nursing facility staff nurses on July 13, 2007. If any questions, you may call [phone] to reschedule this with a  nurse at our office if you wish. He should also have a  PT/INR appointment on July 11, 2007. This should be drawn at the  skilled nursing facility, and the results should  be reviewed by either  the facility's pharmacist or medical director, depending on what your  facility's protocol is for Coumadin management. His INR goal should be  around 2-2.5. Coumadin may be adjusted as needed.      Jerold Coombe, P.A.      Janetta Hora. Fields, MD  Electronically Signed    AWZ/MEDQ  D:  07/05/2007  T:  07/05/2007  Job:  [id number]   cc:   Janetta Hora. Darrick Penna, MD  Mina Marble, M.D.  Eduardo Osier. Sharyn Lull, M.D.

## 2010-10-16 NOTE — Discharge Summary (Signed)
Cristian Miller, RADLER NO.:  [id number]   MEDICAL RECORD NO.:  [id number]                   PATIENT TYPE:  INP   LOCATION:  2038                                 FACILITY:  [facility]   PHYSICIAN:  Mina Marble, M.D.          DATE OF BIRTH:  14-Dec-1930   DATE OF ADMISSION:  07/17/2002  DATE OF DISCHARGE:  07/27/2002                                 DISCHARGE SUMMARY   PROCEDURES:  The patient had a Persantine Cardiolite done on July 20, 2002, ordered by Dr. Sharyn Lull.   ADMISSION DIAGNOSES:  1. Atrial fibrillation with rapid ventricular response, new onset.  2. Unsteady gait episode and severe headache, probably due to diagnosis #1.  3. Hypertensive heart disease, uncontrolled.  4. Elevated cardiac enzymes, rule out ischemic heart disease versus due to     cardiac arrhythmias of atrial fibrillation, new onset.   DISCHARGE DIAGNOSES:  1. Atrial fibrillation with moderate ventricular response.  2. Hypertensive heart disease with early congestive heart failure or severe     systolic dysfunction.  3. Headache, improved, controlled with Tylenol.  4. High risk man.  5. Elevated PSA.   REASON FOR HOSPITALIZATION:  The patient is a 77 year old black male with a  history of hypertensive heart disease who was admitted with complaints of  recent onset of severe headache with unsteady episodes with four to five  days, getting worse.  He is admitted to the [facility] ED  with clinical atrial fibrillation.  The patient relates having a long  history of hypertension without taking hypertension medication 4-5 years ago  after relocating, primarily from [place] to [place], [place].  He relates thinking at this time he just stopped taking high  blood pressure meds since he is retired from work on a regular basis.   ALLERGIES:  CODEINE, which causes severe nausea and vomiting.   DIAGNOSTIC DATA:  On February 20 EKG showed  atrial fibrillation.  A 2-D echo  showed overall left ventricular systolic function with markedly decreased  left ventricular ejection fraction which was estimated to be 25-30%.  There  was no diagnostic evidence of a left ventricular original wall motion  abnormality.  There was mild mitral valvular regurgitation.  The left atrium  was mildly dilated.  CT of the head without contrast on February 17 showed  no intracranially finding, demonstrated mild atrophy.   LABORATORY DATA:  On February 17 CBC showed WBC of 12.3, hemoglobin 16.6,  hematocrit 49.4, platelets of 199.  On July 26, 2002, WBC 10.3,  hemoglobin 15.1, hematocrit 47.1, platelets 203.  On July 27, 2002, PT  was 24, INR was 2.5.  On February 18 chemistry was within normal limits  except potassium 2.9, glucose of 121, albumin 3.3.  On February 17 CK was  252, CK-MB 4.9, relative index 1.9, troponin I 0.21.  Second set CK 155, CK-  MB  3.6, relative index 2.3, troponin 0.16.  On the third set CK 124, CK-MB  2.3, relative index 1.9, troponin 0.07.  On July 18, 2002, liver profile  showed cholesterol 152, triglycerides 44, HDL 42, LDL 101. PSA on February  18 was 4.77.   HOSPITAL COURSE:  The patient was admitted to the telemetry unit.  A 2 gram  sodium diet was started.  He was on bed rest, had some prune juice.  Heparin  protocol was initiated per pharmacy dosing.  Also, patient was started on a  Cardizem drip after a Cardizem bolus.  EKG was obtained, cardiac enzymes  were obtained x3.  He was started on Coreg 6.25 mg q.12h.  He was given  Tylenol p.r.n. for his headache.  PTT and CBCs were monitored per pharmacy.  Liver profile, lipid panel, and PSA levels were obtained.  The patient had  an episode of hypokalemia.  Potassium was given orally.  He had a cardiology  consult done by Dr. Shana Chute which he ordered a Cardiolite and a 2-D echo  with a thyroid panel.  He started the patient on Cardizem CD 300 mg p.o.   daily and Lanoxin 0.25 mg p.o. q.a.m.  He also ordered to start Coumadin per  pharmacy which after Coumadin was started PT&INR was done daily.  He started  on Coumadin 5 mg.  The patient had a cough that was consistent with acute  bronchitis.  He was started on Robitussin and started on Advair inhaler  100/50 q.a.m. and q.h.s.  His Cardizem was tapered off.  He was started on  Remeron 15 mg q.p.m. with Ambien p.r.n. for sleep.  The patient had an  episode after starting the Remeron of confusion.  The patient was taken off  Remeron and started on Xanax 0.25 mg q.a.m. and q.p.m. p.r.n. for rest.  The  patient's heparin and Coumadin was increased accordingly.  Adding to his  hypertensive treatment he was started on Altace 2.5 mg and then it was  increased to 10 mg.  He was started on Coumadin 10 mg p.o. daily.  His Coreg  was increased to 12.5 mg.  And also added to his present care was Benicar 20  mg daily and also hydrochlorothiazide 12.5 mg.  Avapro 300 mg tab daily was  substituted for the Benicar.  His Cardizem was changed to Cardizem LA 240 mg  p.o. daily and his heparin was tapered off.  Dr. Brunilda Payor was consulted for  elevated PSA and he was going to plan to do ultrasound and do prostate  biopsy when the patient could be off of Coumadin for about five days.   DISCHARGE CONDITION:  The patient discharged to home in stable condition  with headache under control and patient feeling better.   DISCHARGE MEDICATIONS:  1. Cardizem LA 240 mg 1 daily.  2. Lanoxin 0.25 mg tab p.o. daily.  3. Coreg 25 mg tab 1 b.i.d.  4. Coumadin 2.5 mg tab 1 q. 4 p.m.  5. Altace 2 mg tab 1 daily.  6. Benicar hydrochlorothiazide 40/12.5 mg tab 1 q.i.d.  7. K-Dur 20 mEq tab 1 daily.   DISCHARGE INSTRUCTIONS:  PAIN MANAGEMENT: Tylenol 650 mg tab 2,3,2,3 325 mg  tab q.4h. p.r.n. for headaches.  ACTIVITY:  As tolerated.  DIET.  No added salt diet. FOLLOW UP APPOINTMENT:  Call Dr. Petra Kuba also for appointment  in late  March 2004.  He will need blood draw so do not eat for at least four  hours  prior to appointment.  Urology appointment with Dr. Brunilda Payor after appointment  with Dr. Petra Kuba in March 2004.     Dionne D. Retta Mac, M.D.    DDR/MEDQ  D:  08/26/2002  T:  08/27/2002  Job:  [id number]   cc:   Lindaann Slough, M.D.  [address]  [place]  [place] [address]  Fax: [phone]   Eduardo Osier. Sharyn Lull, M.D.  [address]  [place]  [place] [address]  Fax: [phone]   Osvaldo Shipper. Spruill, M.D.  [address] [address]  [place]  [place] [address]  Fax: [phone]

## 2010-10-16 NOTE — Consult Note (Signed)
   NAMENUMAN, ZYLSTRA NO.:  [id number]   MEDICAL RECORD NO.:  [id number]                   PATIENT TYPE:  INP   LOCATION:  2038                                 FACILITY:  [facility]   PHYSICIAN:  Lindaann Slough, M.D.               DATE OF BIRTH:  25-Dec-1930   DATE OF CONSULTATION:  07/20/2002  DATE OF DISCHARGE:                                   CONSULTATION   REQUESTING PHYSICIAN:  Dr. Mina Marble.   REASON FOR CONSULTATION:  Elevated PSA.   HISTORY OF PRESENT ILLNESS:  The patient is a 77 year old who was admitted  with atrial fibrillation and who is on heparin and Coumadin.  He was found  to have an elevated PSA.  His PSA is 4.77 with a free PSA of 8%.  The  patient denies frequency, urgency, hesitancy or straining on urination or  hematuria.  He has had no previous GU surgery.   PHYSICAL EXAMINATION:  ABDOMEN:  On physical examination, he has no CVA  tenderness.  His kidneys are not palpable.  His bladder is not distended.  GU:  Penis is uncircumcised and meatus is normal.  Scrotum is unremarkable.  He has no hydrocele.  Both testes are normal.  There is swelling under the  head of the right epididymis that is firm, nontender and well-defined.  Right and left cords are normal.  RECTAL:  On rectal examination, he has no external hemorrhoids.  Sphincter  tone is normal.  Prostate is enlarged, 40 g, no nodules.  Seminal vesicles  not palpable.   LABORATORY DATA:  BUN is 9, creatinine 1.0.  Urinalysis shows 0-2 rbc's and  0-2 wbc's.   IMPRESSION:  1. Benign prostatic hypertrophy.  2. Elevated prostate-specific antigen.  3. Differential diagnosis includes also carcinoma of prostate.    SUGGESTION:  Ultrasound and prostate biopsy when the patient is medically  stable and when he can be kept off Coumadin for about five days.   Thank you.  We will follow the patient with you.        Lindaann Slough, M.D.    MN/MEDQ  D:  07/20/2002  T:  07/20/2002  Job:  [id number]   cc:   Eino Farber., M.D.  [address] [place]  [place] [address]  Fax: [phone]

## 2010-10-16 NOTE — Discharge Summary (Signed)
Cristian Miller, Cristian Miller NO.:  [id number]   MEDICAL RECORD NO.:  [id number]          PATIENT TYPE:  INP   LOCATION:  4731                         FACILITY:  [facility]   PHYSICIAN:  Mina Marble, M.D.DATE OF BIRTH:  05-10-1931   DATE OF ADMISSION:  08/12/2005  DATE OF DISCHARGE:  08/16/2005                                 DISCHARGE SUMMARY   DISCHARGE DIAGNOSES:  1.  Congestive heart failure with mild pulmonary edema.  2.  Atrial fibrillation, controlled.  3.  Dyspnea, probably related to diagnosis #1.  4.  Pulmonary embolism history on anticoagulant therapy at present with      satisfactory anticoagulation.  5.  Hypertensive heart disease with congestive heart failure.  6.  Hypopotassemia.  7.  History of past noncompliance with medication therapy due to history of      poor financial condition.  8.  Hypercholesterolemia.  9.  Impaired glucose tolerance.  10. Long-term anticoagulant therapy high risk med.  11. Prostatic malignancy, previously treated with prostate surgery and      radiation.   BRIEF HISTORY:  The patient is a 77 year old black male with a known history  of atrial fibrillation, a past history of atrial thrombus, who was found to  have a small pulmonary embolism, in spite of adequate anticoagulant therapy,  and also with a complaint of shortness of breath at rest.  He beta  natruretic peptide level was elevate to 407.  The patient has been having  difficulty taking all of his recommended cardiac medications, which  consisted also of Coreg 25 mg p.o. b.i.d., and which he may not take on a  regular basis.   PAST HISTORY:  The patient has a past history of having an arterial artery  Doppler study of his lower extremities, as well as a venous Doppler study  which revealed an unusual type finding in his legs.   SOCIAL HISTORY:  The patient does have a history of smoking.   FAMILY HISTORY:  Noncontributory.   REVIEW OF SYSTEMS:  HEENT,  pulmonary, GI and neurologic symptoms to be as  noted.   ALLERGIES:  No known drug allergies, but he has GI intolerance to CODEINE.   PHYSICAL EXAMINATION:  GENERAL:  Pertinent physical findings on August 12, 2005 revealed the patient to be a well-developed, well-nourished black male  oriented x3.  VITAL SIGNS:  On admission, temperature was 97.2, pulse 86 with some periods  of regularity, and some periods of irregularity.  Respirations 20, blood  pressure 180/116 (after medications, it improved to 150/94).  LUNGS:  A few fine rales.  HEART:  An irregular rhythm, consistent with his history of atrial  fibrillation.  ABDOMEN:  Soft with no masses or organomegaly.  EXTREMITIES:  No edema, and there was good strength.   HOSPITAL COURSE:  The patient's hospital course was one of gradual  improvement.  He was given IV Lasix to improve his pulmonary congestion with  gradual improvement.  He was also continued on Coumadin as his anticoagulant  therapy, with the dose being adjusted as needed.  The patient's shortness  of  breath did gradually improve on his medications, including Coreg 25 mg p.o.  b.i.d. and Altace 10 mg p.o. daily, Cardizem 240 mg LA daily, and Avapro 300  mg tablets p.o. daily, and hydrochlorothiazide 25 mg p.o. daily, with his  blood pressure improving, on August 16, 2005 to 125/79 mmHg.  The patient was  discharged on August 16, 2005 on the above medications, and the Coumadin was  adjusted to 4 mg p.o. daily with a good anticoagulant level in the 2.5 to 3  INR range.  Advicor 500 mg/20 tablets one q.h.s. with applesauce was also  added for cholesterol.  His Lasix was changed to 40 mg tablets p.o. daily  with the addition of Aldactone 50 mg tablets one daily or Spironolactone.  The patient's serum potassium did gradually improve from a low of 3.1 mEq  per liter on August 15, 2005 to a value of 3.8 mEq per liter on August 16, 2005 using the addition of potassium chloride 20 mEq  p.o. b.i.d.   DISCHARGE INSTRUCTIONS:  The patient was to have a follow up in my office,  Mina Marble, M.D., on September 20, 2005, for further evaluation  including blood work.  The patient was to continue on a low concentrated  sweets, low cholesterol, no added salt diet.  He can ambulate as tolerated.      Mina Marble, M.D.  Electronically Signed     GRK/MEDQ  D:  10/03/2005  T:  10/04/2005  Job:  [id number]

## 2010-10-16 NOTE — Discharge Summary (Signed)
NAMESALADIN, PETRELLI NO.:  [id number]   MEDICAL RECORD NO.:  [id number]          PATIENT TYPE:  INP   LOCATION:  3710                         FACILITY:  [facility]   PHYSICIAN:  Mina Marble, M.D.DATE OF BIRTH:  Sep 09, 1930   DATE OF ADMISSION:  12/21/2004  DATE OF DISCHARGE:  12/28/2004                                 DISCHARGE SUMMARY   ADMISSION DIAGNOSES:  1.  Cardiac left atrial thrombus.  2.  Atrial fibrillation with good rate.  3.  Congestive heart failure, history of hypertensive heart disease      difficult to control but fair control.  4.  Status post prostate cancer with radiation therapy, controlled.   DISCHARGE DIAGNOSES:  1.  Cardiac left atrial thrombus on treatment.  2.  Atrial fibrillation with rate controlled.  3.  Hypertensive heart disease difficult to control.  4.  Congestive heart failure, history of.  5.  Status post prostate cancer, positive radiation.   REASON FOR ADMISSION:  Patient is a 77 year old African American male with  known atrial fibrillation with good rate control and is admitted from the  [facility] ED with complaints of feeling shortness of  breath with discomfort in the chest and is found on chest CT scan to have a  large left atrial thrombus.   ALLERGIES:  CODEINE.   LABORATORIES AND STUDIES:  1.  December 21, 2004, EKG showed atrial fibrillation.  2.  December 22, 2004, CT of the chest showed a prominent filling defect in the      left atrial __________ most likely thrombus, although neoplasm could      have this appearance.  3.  Chest x-ray on December 21, 2004, shows cardiomegaly without evidence for      acute pulmonary abnormality.   CBC on December 21, 2004, showed wbc 11.1, hemoglobin 16, hematocrit 47.8,  platelets 185.  On December 28, 2004, wbc 8.4, hemoglobin 14.7, hematocrit 44.2,  platelets 241.  On December 21, 2004, D-dimer was greater than 20.  On December 22, 2004, PT was 13.4, INR was 1.0.  On   December 28, 2004, PT was 24, INR 2.1.  Chemistry studies on December 21, 2004, was within normal limits.  On December 21, 2004, B type natriuretic peptide was 109.6.  On December 24, 2004, lipid profile  showed cholesterol 173, triglycerides 61, HDL of 40, LDL 121.  On December 22, 2004, PSA was 0.50.  On December 22, 2004, digoxin was less than 0.2.  On December 21, 2004, CK-MB was 1.4, troponin less than 0.05.   HOSPITAL COURSE:  Patient was admitted to the telemetry bed.  Heparin was  started per pharmacy dosing and also Coumadin was started at 7.5 mg p.o.  daily.  A 2-D echo was performed.  Also patient was started on digoxin 0.125  mg and Coreg 12.5 mg p.o. b.i.d.  Also Altace, Cardizem, Lasix, Benicar.  Also patient was given a potassium supplement.  Prothrombin times were  performed daily.  Patient was initially started on a heparin 500 unit bolus,  then  started on heparin 1300 units an hour.  CBCs were performed daily with  heparin levels.  Coumadin was increased to 10 mg p.o. daily, Coreg was  increased to 25 mg one tablet b.i.d. Also, Altace was increased to 10 mg  tablet p.o. daily.  Coumadin was decreased to 7.5 mg. To help with sleep,  patient was given Ambien.  His Cardizem was increased to 40 mg p.o.  Patient's heparin was discontinued.   CONDITION ON DISCHARGE:  Patient is discharged  home in stable condition.  At discharge, blood pressure is 140/100 without chest discomfort.   DISCHARGE MEDICATIONS:  1.  Avapro 300 mg tablet one daily.  2.  Hydrochlorothiazide 25 mg tablet daily or Avalide 300/32.  3.  Cardizem LA 240 mg tablet one daily.  4.  Coumadin 5 mg tablet one daily or as directed, 1-1/2 tablet every      Monday.  5.  Lanoxin or digoxin 0.125 mg tablet daily.  6.  Coreg 25 mg tablet one b.i.d.  7.  Altace 10 mg caps one daily.  8.  K-Dur 20 mEq tablet one daily.   PAIN MANAGEMENT:  Tylenol 650 mg tablet one q.6h. p.r.n. for pain.   DISCHARGE INSTRUCTIONS:  1.  Diet:  No added  salt diet.  2.  Activity:  No heavy lifting over 25 pounds.   Return to Dr. Consuello Bossier office on January 25, 2005, at 11:30.      Dionne D. Retta Mac, M.D.  Electronically Signed    DDR/MEDQ  D:  02/21/2005  T:  02/22/2005  Job:  [id number]

## 2010-12-20 ENCOUNTER — Inpatient Hospital Stay (INDEPENDENT_AMBULATORY_CARE_PROVIDER_SITE_OTHER)
Admission: RE | Admit: 2010-12-20 | Discharge: 2010-12-20 | Disposition: A | Discharge status: Home / Self Care | Payer: Medicare Other | Source: Ambulatory Visit | Attending: Family Medicine | Admitting: Family Medicine

## 2010-12-20 ENCOUNTER — Ambulatory Visit (INDEPENDENT_AMBULATORY_CARE_PROVIDER_SITE_OTHER): Payer: Medicare Other

## 2010-12-20 DIAGNOSIS — M1990 Unspecified osteoarthritis, unspecified site: Secondary | ICD-10-CM

## 2011-02-17 LAB — COMPREHENSIVE METABOLIC PANEL
ALT: 14
Albumin: 3.2 — ABNORMAL LOW
Calcium: 9.4
Chloride: 110
Creatinine, Ser: 1.37
GFR calc non Af Amer: 51 — ABNORMAL LOW
Glucose, Bld: 116 — ABNORMAL HIGH
Potassium: 4.5
Sodium: 141
Total Protein: 6.6

## 2011-02-17 LAB — APTT
aPTT: 29
aPTT: 37

## 2011-02-17 LAB — PROTIME-INR
INR: 1.2
INR: 2 — ABNORMAL HIGH
Prothrombin Time: 15.8 — ABNORMAL HIGH
Prothrombin Time: 23.5 — ABNORMAL HIGH

## 2011-02-17 LAB — CBC
MCHC: 33
MCV: 94.6

## 2011-02-18 LAB — POCT I-STAT 7, (LYTES, BLD GAS, ICA,H+H)
Acid-base deficit: 3 — ABNORMAL HIGH
Acid-base deficit: 4 — ABNORMAL HIGH
Acid-base deficit: 6 — ABNORMAL HIGH
Bicarbonate: 24.4 — ABNORMAL HIGH
Calcium, Ion: 0.91 — ABNORMAL LOW
Calcium, Ion: 1.13
Calcium, Ion: 1.23
Calcium, Ion: 1.23
HCT: 43
HCT: 43
Hemoglobin: 14.6
Hemoglobin: 9.9 — ABNORMAL LOW
O2 Saturation: 100
O2 Saturation: 99
O2 Saturation: 99
Operator id: 198871
Operator id: 230421
Operator id: 300801
Patient temperature: 36
Patient temperature: 36.5
Patient temperature: 36.7
Patient temperature: 37.3
Patient temperature: 37.4
Potassium: 4.1
Potassium: 4.5
Potassium: 4.6
Potassium: 4.9
Sodium: 140
TCO2: 18
TCO2: 22
TCO2: 26
pCO2 arterial: 32.8 — ABNORMAL LOW
pCO2 arterial: 33.4 — ABNORMAL LOW
pCO2 arterial: 37.2
pH, Arterial: 7.388
pH, Arterial: 7.42
pO2, Arterial: 164 — ABNORMAL HIGH
pO2, Arterial: 198 — ABNORMAL HIGH
pO2, Arterial: 257 — ABNORMAL HIGH

## 2011-02-18 LAB — COMPREHENSIVE METABOLIC PANEL
ALT: 11
ALT: 203 — ABNORMAL HIGH
AST: 20
Albumin: 2.5 — ABNORMAL LOW
Alkaline Phosphatase: 66
Alkaline Phosphatase: 70
BUN: 19
CO2: 28
Calcium: 9.1
Chloride: 103
Creatinine, Ser: 1.22
GFR calc Af Amer: 52 — ABNORMAL LOW
GFR calc non Af Amer: 58 — ABNORMAL LOW
Glucose, Bld: 122 — ABNORMAL HIGH
Potassium: 3.2 — ABNORMAL LOW
Sodium: 137
Sodium: 139
Total Bilirubin: 1.2
Total Protein: 4.6 — ABNORMAL LOW
Total Protein: 6

## 2011-02-18 LAB — TYPE AND SCREEN
ABO/RH(D): O POS
Antibody Screen: NEGATIVE

## 2011-02-18 LAB — PREPARE FRESH FROZEN PLASMA

## 2011-02-18 LAB — URINE MICROSCOPIC-ADD ON

## 2011-02-18 LAB — CBC
HCT: 31.7 — ABNORMAL LOW
HCT: 36.3 — ABNORMAL LOW
HCT: 36.6 — ABNORMAL LOW
HCT: 38 — ABNORMAL LOW
HCT: 39.7
Hemoglobin: 10.6 — ABNORMAL LOW
Hemoglobin: 11.7 — ABNORMAL LOW
Hemoglobin: 12.1 — ABNORMAL LOW
Hemoglobin: 12.2 — ABNORMAL LOW
Hemoglobin: 12.6 — ABNORMAL LOW
Hemoglobin: 13.4
Hemoglobin: 13.7
Hemoglobin: 8 — ABNORMAL LOW
MCHC: 33
MCHC: 33.3
MCHC: 33.7
MCHC: 33.7
MCV: 93.7
MCV: 94.3
MCV: 94.5
Platelets: 108 — ABNORMAL LOW
Platelets: 139 — ABNORMAL LOW
Platelets: 222
Platelets: 261
Platelets: 84 — ABNORMAL LOW
RBC: 3.48 — ABNORMAL LOW
RBC: 3.89 — ABNORMAL LOW
RBC: 3.9 — ABNORMAL LOW
RBC: 4.28
RBC: 4.32
RBC: 4.46
RDW: 15.3
RDW: 15.7 — ABNORMAL HIGH
RDW: 15.8 — ABNORMAL HIGH
RDW: 15.9 — ABNORMAL HIGH
RDW: 15.9 — ABNORMAL HIGH
RDW: 16.1 — ABNORMAL HIGH
RDW: 16.1 — ABNORMAL HIGH
RDW: 16.3 — ABNORMAL HIGH
WBC: 10.6 — ABNORMAL HIGH
WBC: 13.1 — ABNORMAL HIGH
WBC: 13.9 — ABNORMAL HIGH
WBC: 14.9 — ABNORMAL HIGH
WBC: 16.8 — ABNORMAL HIGH
WBC: 21.4 — ABNORMAL HIGH

## 2011-02-18 LAB — POCT I-STAT 3, ART BLOOD GAS (G3+)
O2 Saturation: 92
TCO2: 24
pCO2 arterial: 35.7
pH, Arterial: 7.417
pO2, Arterial: 62 — ABNORMAL LOW

## 2011-02-18 LAB — BASIC METABOLIC PANEL
BUN: 12
BUN: 14
CO2: 25
CO2: 25
CO2: 25
Calcium: 8.2 — ABNORMAL LOW
Calcium: 8.5
Calcium: 8.5
Calcium: 8.8
Chloride: 101
Chloride: 106
Chloride: 106
Creatinine, Ser: 1.5
GFR calc Af Amer: 41 — ABNORMAL LOW
GFR calc Af Amer: 55 — ABNORMAL LOW
GFR calc non Af Amer: 34 — ABNORMAL LOW
GFR calc non Af Amer: 48 — ABNORMAL LOW
GFR calc non Af Amer: 48 — ABNORMAL LOW
GFR calc non Af Amer: 56 — ABNORMAL LOW
GFR calc non Af Amer: >60
Glucose, Bld: 109 — ABNORMAL HIGH
Glucose, Bld: 128 — ABNORMAL HIGH
Glucose, Bld: 130 — ABNORMAL HIGH
Glucose, Bld: 144 — ABNORMAL HIGH
Glucose, Bld: 157 — ABNORMAL HIGH
Glucose, Bld: 171 — ABNORMAL HIGH
Potassium: 3.1 — ABNORMAL LOW
Potassium: 3.6
Potassium: 4.1
Sodium: 136
Sodium: 137
Sodium: 137
Sodium: 137
Sodium: 140
Sodium: 143

## 2011-02-18 LAB — BLOOD GAS, ARTERIAL
Acid-base deficit: 2.3 — ABNORMAL HIGH
Bicarbonate: 21.4
Drawn by: 274481
FIO2: 0.21
O2 Saturation: 96
Patient temperature: 98.6
TCO2: 22.4
pCO2 arterial: 33.2 — ABNORMAL LOW
pH, Arterial: 7.425
pO2, Arterial: 83.7

## 2011-02-18 LAB — CROSSMATCH

## 2011-02-18 LAB — URINALYSIS, ROUTINE W REFLEX MICROSCOPIC
Glucose, UA: NEGATIVE
Ketones, ur: NEGATIVE
Protein, ur: 30 — AB

## 2011-02-18 LAB — HEPARIN INDUCED PLATELET AGGREGATION (CONVERTED LAB)
Heparin 0.1 Patient: 6
Heparin 1 Donor: 1
Interpretation-HITDU: NEGATIVE
Saline Donor:: 6

## 2011-02-18 LAB — HEPARIN LEVEL (UNFRACTIONATED): Heparin Unfractionated: <0.1 — ABNORMAL LOW

## 2011-02-18 LAB — PROTIME-INR
INR: 2.1 — ABNORMAL HIGH
INR: 2.3 — ABNORMAL HIGH
INR: 4.2 — ABNORMAL HIGH
Prothrombin Time: 22.1 — ABNORMAL HIGH
Prothrombin Time: 22.6 — ABNORMAL HIGH
Prothrombin Time: 24.3 — ABNORMAL HIGH
Prothrombin Time: 25.8 — ABNORMAL HIGH
Prothrombin Time: 42.2 — ABNORMAL HIGH

## 2011-02-18 LAB — HEPARIN ASSOCIATED ANTIBODY DETECTION (CONVERTED LAB): Heparin Associated Ab Detection: NEGATIVE

## 2011-02-18 LAB — ABO/RH: ABO/RH(D): O POS

## 2011-02-18 LAB — APTT: aPTT: 28

## 2011-02-19 LAB — CBC
HCT: 31.4 — ABNORMAL LOW
HCT: 31.8 — ABNORMAL LOW
HCT: 42.6
Hemoglobin: 10.3 — ABNORMAL LOW
Hemoglobin: 10.7 — ABNORMAL LOW
MCV: 91.7
MCV: 90.7
Platelets: 172
RBC: 3.42 — ABNORMAL LOW
RBC: 3.5 — ABNORMAL LOW
RDW: 15.3
RDW: 15.7 — ABNORMAL HIGH
WBC: 14.7 — ABNORMAL HIGH
WBC: 19 — ABNORMAL HIGH
WBC: 12.1 — ABNORMAL HIGH

## 2011-02-19 LAB — URINE MICROSCOPIC-ADD ON

## 2011-02-19 LAB — PROTIME-INR
INR: 2.4 — ABNORMAL HIGH
INR: 2.4 — ABNORMAL HIGH
INR: 2.5 — ABNORMAL HIGH
INR: 1.2
Prothrombin Time: 27.2 — ABNORMAL HIGH
Prothrombin Time: 27.3 — ABNORMAL HIGH

## 2011-02-19 LAB — URINALYSIS, ROUTINE W REFLEX MICROSCOPIC
Glucose, UA: NEGATIVE
Specific Gravity, Urine: 1.014
pH: 7.5

## 2011-02-19 LAB — COMPREHENSIVE METABOLIC PANEL
AST: 20
Albumin: 3.5
BUN: 27 — ABNORMAL HIGH
Chloride: 103
Creatinine, Ser: 1.57 — ABNORMAL HIGH
GFR calc Af Amer: 53 — ABNORMAL LOW
Potassium: 4.5
Total Protein: 7.3

## 2011-02-19 LAB — URINE CULTURE: Colony Count: >=100000

## 2011-02-19 LAB — BASIC METABOLIC PANEL
GFR calc Af Amer: >60
GFR calc non Af Amer: 59 — ABNORMAL LOW
Glucose, Bld: 122 — ABNORMAL HIGH
Potassium: 4.2
Sodium: 135

## 2011-02-19 LAB — APTT: aPTT: 24

## 2011-02-22 LAB — CBC
HCT: 38.2 — ABNORMAL LOW
HCT: 42.7
Hemoglobin: 12.6 — ABNORMAL LOW
Hemoglobin: 13.9
MCV: 91.9
MCV: 92.1
Platelets: 172
RBC: 4.14 — ABNORMAL LOW
RBC: 4.64
WBC: 10.4
WBC: 8.1

## 2011-02-22 LAB — BASIC METABOLIC PANEL
Chloride: 104
GFR calc non Af Amer: 44 — ABNORMAL LOW
Potassium: 4.3
Sodium: 137

## 2011-04-16 ENCOUNTER — Emergency Department (INDEPENDENT_AMBULATORY_CARE_PROVIDER_SITE_OTHER)
Admission: EM | Admit: 2011-04-16 | Discharge: 2011-04-16 | Disposition: A | Discharge status: Home / Self Care | Payer: Medicare Other | Source: Home / Self Care | Attending: Emergency Medicine | Admitting: Emergency Medicine

## 2011-04-16 DIAGNOSIS — H209 Unspecified iridocyclitis: Secondary | ICD-10-CM

## 2011-04-16 DIAGNOSIS — H109 Unspecified conjunctivitis: Secondary | ICD-10-CM

## 2011-04-16 HISTORY — DX: Essential (primary) hypertension: I10

## 2011-04-16 HISTORY — DX: Unspecified atrial fibrillation: I48.91

## 2011-04-16 MED ORDER — CIPROFLOXACIN HCL 0.3 % OP SOLN: OPHTHALMIC | Status: AC

## 2011-04-16 MED ORDER — TETRACAINE HCL 0.5 % OP SOLN
2.0000 [drp] | Freq: Once | OPHTHALMIC | Status: AC
  Administered 2011-04-16: 2 [drp] via OPHTHALMIC

## 2011-04-16 MED ORDER — POLYETHYL GLYCOL-PROPYL GLYCOL 0.4-0.3 % OP SOLN: 1.0000 [drp] | Freq: Four times a day (QID) | OPHTHALMIC | Status: DC | PRN

## 2011-04-16 MED ORDER — TETRACAINE HCL 0.5 % OP SOLN
OPHTHALMIC | Status: AC
  Filled 2011-04-16: qty 2

## 2011-04-16 NOTE — ED Notes (Signed)
Reports eye pain since yesterday; denies injury; left eye reddened

## 2011-04-16 NOTE — ED Provider Notes (Signed)
History     CSN: [id number] Arrival date & time: 04/16/2011  4:38 PM   First MD Initiated Contact with Patient 04/16/11 1636      Chief Complaint  Patient presents with  . Eye Problem    (Consider location/radiation/quality/duration/timing/severity/associated sxs/prior treatment) HPI Comments: Pt c/o left eye pain with EOM'x 2 days,  redness, increased tearing, eye discharge starting last night. C/o left sided HA last night. Mild photophobia.  No periorbital edema, redness. No visual changes, N/V. H/o similar sx in left eye "years ago" when having leg amputated. States saw Dr. Dione Booze for routine eye exam yesterday was sent home with an unknown drop for both eyes which pt states is taking as directed. Does not know what it is for. States was having left eye pain prior to seeing Dr. Dione Booze.   Patient is a 77 y.o. male presenting with eye problem. The history is provided by the patient.  Eye Problem  This is a new problem. The current episode started yesterday. The problem occurs constantly. There is pain in the left eye. There was no injury mechanism. There is no history of trauma to the eye. There is no known exposure to pink eye. He does not wear contacts. Associated symptoms include discharge and eye redness. Pertinent negatives include no numbness, no blurred vision, no decreased vision, no double vision, no foreign body sensation, no nausea, no vomiting, no tingling, no weakness and no itching. He has tried nothing for the symptoms. The treatment provided no relief.    Past Medical History  Diagnosis Date  . Hypertension   . Atrial fibrillation   . Cataract     Past Surgical History  Procedure Date  . Leg amputation   . Cataract extraction     History reviewed. No pertinent family history.  History  Substance Use Topics  . Smoking status: Not on file  . Smokeless tobacco: Not on file  . Alcohol Use: No      Review of Systems  Constitutional: Negative for fever.  HENT:  Negative for ear pain, congestion and rhinorrhea.   Eyes: Positive for discharge and redness. Negative for blurred vision, double vision, itching and visual disturbance.  Gastrointestinal: Negative for nausea and vomiting.  Skin: Negative for itching.  Neurological: Negative for tingling, weakness and numbness.    Allergies  Review of patient's allergies indicates no known allergies.  Home Medications   Current Outpatient Rx  Name Route Sig Dispense Refill  . WARFARIN SODIUM 1 MG PO TABS Oral Take 1 mg by mouth as directed.      Marland Kitchen CIPROFLOXACIN HCL 0.3 % OP SOLN  1-2 drops in affected eye 4 times/day x 5 days 5 mL 0  . POLYETHYL GLYCOL-PROPYL GLYCOL 0.4-0.3 % OP SOLN Ophthalmic Apply 1 drop to eye 4 (four) times daily as needed. 5 mL 0    BP 142/99  Pulse 86  Temp(Src) 98.3 F (36.8 C) (Oral)  Resp 20  SpO2 96%  Physical Exam  Nursing note and vitals reviewed. Constitutional: He is oriented to person, place, and time. He appears well-developed and well-nourished.  HENT:  Head: Normocephalic and atraumatic.  Eyes: Pupils are equal, round, and reactive to light. Right eye exhibits no chemosis, no discharge and no exudate. Left eye exhibits discharge and exudate. No foreign body present in the left eye. Left conjunctiva is injected. Left conjunctiva has no hemorrhage.  Slit lamp exam:      The left eye shows no corneal abrasion, no corneal  flare, no corneal ulcer, no foreign body, no hyphema, no hypopyon and no fluorescein uptake.       Vis acuity 20/20 bilaterally. tonopen 17 L eye, 16 R eye. apthous ulcers lower inner eyelid laterally. (+) mild photophobia bilaterally  Neck: Normal range of motion.  Cardiovascular: Regular rhythm.   Pulmonary/Chest: Effort normal. No respiratory distress.  Abdominal: He exhibits no distension.  Musculoskeletal: Normal range of motion. He exhibits no edema and no tenderness.  Neurological: He is alert and oriented to person, place, and time.    Skin: Skin is warm and dry. No rash noted.  Psychiatric: He has a normal mood and affect. His behavior is normal.    ED Course  Procedures (including critical care time)  Labs Reviewed - No data to display No results found.   1. Conjunctivitis   2. Iritis       MDM  No evidence of glaucoma. Appears to be most c/w viral conjuncitivis & iritis. Would think that rxn to eyedrops pt is on from optho's office would cause sx in both eyes. D/w to return to ED.   Luiz Blare, MD 04/16/11 [phone]

## 2011-07-02 ENCOUNTER — Other Ambulatory Visit: Payer: Self-pay | Admitting: Cardiology

## 2011-07-05 ENCOUNTER — Other Ambulatory Visit: Payer: Self-pay | Admitting: Cardiology

## 2011-11-01 ENCOUNTER — Other Ambulatory Visit: Payer: Self-pay | Admitting: Cardiology

## 2012-03-06 ENCOUNTER — Other Ambulatory Visit: Payer: Self-pay | Admitting: *Deleted

## 2012-03-06 DIAGNOSIS — I739 Peripheral vascular disease, unspecified: Secondary | ICD-10-CM

## 2012-03-06 DIAGNOSIS — Z48812 Encounter for surgical aftercare following surgery on the circulatory system: Secondary | ICD-10-CM

## 2012-03-08 ENCOUNTER — Encounter: Payer: Self-pay | Admitting: Vascular Surgery

## 2012-03-09 ENCOUNTER — Encounter: Payer: Self-pay | Admitting: Vascular Surgery

## 2012-03-09 ENCOUNTER — Ambulatory Visit (INDEPENDENT_AMBULATORY_CARE_PROVIDER_SITE_OTHER): Payer: Medicare Other | Admitting: Vascular Surgery

## 2012-03-09 ENCOUNTER — Encounter (INDEPENDENT_AMBULATORY_CARE_PROVIDER_SITE_OTHER): Payer: Medicare Other

## 2012-03-09 VITALS — BP 150/101 | HR 95 | Resp 18 | Ht 66.0 in | Wt 180.0 lb

## 2012-03-09 DIAGNOSIS — I739 Peripheral vascular disease, unspecified: Secondary | ICD-10-CM

## 2012-03-09 DIAGNOSIS — Z48812 Encounter for surgical aftercare following surgery on the circulatory system: Secondary | ICD-10-CM

## 2012-03-09 NOTE — Progress Notes (Signed)
[facility] [place] HISTORY AND PHYSICAL   History of Present Illness:  Patient is a 78 y.o. year old male who presents for evaluation of ischemia left foot.  The patient previously underwent emergency iliac thrombectomy and axbifem and 2009. He subsequently occluded the axbifem. Discussions were held regarding a possible right above-knee amputation at that time. The patient ultimately went to the [facility] for a second opinion. He had a right above-knee amputation in 2009 at the [facility]. He has not been seen in followup since then. Other medical problems include atrial fibrillation, hypertension, some element of dementia, congestive heart failure. The patient was incompetent make medical decisions when he was seen in the hospital by Dr. Jeanie Sewer in 2009. His family member that is with him today states that she is currently making decisions for him medically with his consultation. He is non-ambulatory. He does use his left leg to transfer. He is able to do some daily activities. He lives with another family member. He denies rest pain in the left foot. He has no ulcerations in the left foot. He was sent by Dr. Sharyn Lull but the patient or the family member do not know the exact reason why he was sent.    Past Medical History  Diagnosis Date  . Hypertension   . Atrial fibrillation   . Cataract   . Peripheral vascular disease   . DVT (deep venous thrombosis)   . CHF (congestive heart failure)     Past Surgical History  Procedure Date  . Leg amputation   . Cataract extraction   . Pr vein bypass graft,aorto-fem-pop     2009  right axillary bifemoral bypass and bilateral iliofemoral thrombectomies     Social History History  Substance Use Topics  . Smoking status: Former Smoker    Quit date: 06/01/1991  . Smokeless tobacco: Not on file  . Alcohol Use: No    Family History History reviewed. No pertinent family history.  Allergies  No Known Allergies   Current  Outpatient Prescriptions  Medication Sig Dispense Refill  . diltiazem (DILACOR XR) 240 MG 24 hr capsule Take 240 mg by mouth daily.      Marland Kitchen omeprazole (PRILOSEC) 20 MG capsule Take 20 mg by mouth daily.      . ramipril (ALTACE) 2.5 MG capsule Take 2.5 mg by mouth daily.      Bertram Gala Glycol-Propyl Glycol (SYSTANE) 0.4-0.3 % SOLN Apply 1 drop to eye 4 (four) times daily as needed.  5 mL  0  . warfarin (COUMADIN) 1 MG tablet Take 1 mg by mouth as directed.          ROS:   General:  No weight loss, Fever, chills  HEENT: No recent headaches, no nasal bleeding, no visual changes, no sore throat  Neurologic: No dizziness, blackouts, seizures. No recent symptoms of stroke or mini- stroke. No recent episodes of slurred speech, or temporary blindness.  Cardiac: No recent episodes of chest pain/pressure, no shortness of breath at rest.  No shortness of breath with exertion.  Denies history of atrial fibrillation or irregular heartbeat  Vascular: No history of rest pain in feet.  No history of claudication.  No history of non-healing ulcer, No history of DVT   Pulmonary: No home oxygen, no productive cough, no hemoptysis,  No asthma or wheezing  Musculoskeletal:  [ ]  Arthritis, [ ]  Low back pain,  [ ]  Joint pain  Hematologic:No history of hypercoagulable state.  No history  of easy bleeding.  No history of anemia  Gastrointestinal: No hematochezia or melena,  No gastroesophageal reflux, no trouble swallowing  Urinary: [ ]  chronic Kidney disease, [ ]  on HD - [ ]  MWF or [ ]  TTHS, [ ]  Burning with urination, [ ]  Frequent urination, [ ]  Difficulty urinating;   Skin: No rashes  Psychological: No history of anxiety,  No history of depression   Physical Examination  Filed Vitals:   03/09/12 1253  BP: 150/101  Pulse: 95  Resp: 18  Height: 5\' 6"  (1.676 m)  Weight: 180 lb (81.647 kg)    Body mass index is 29.05 kg/(m^2).  General:  Alert and oriented, no acute distress, slow to answer  some questions HEENT: Normal Neck: No bruit or JVD Pulmonary: Clear to auscultation bilaterally Cardiac: Irregularly irregular without murmur Abdomen: Soft, non-tender, non-distended Skin: No rash Extremity Pulses:  2+ radial, brachial, femoral, absent popliteal dorsalis pedis, posterior tibial pulses left leg Musculoskeletal: Well-healed right above-knee amputation no left lower extremity edema  Neurologic: Upper and lower extremity motor 5/5 and symmetric  DATA: The patient had a left lower extremity ABI today which showed no flow in the peroneal posterior tibial or dorsalis pedis artery and a digit pressure; he did have monophasic flow in the left popliteal artery. I reviewed and interpreted this study.   ASSESSMENT: Asymptomatic but severe ischemia by Doppler exam today left leg.   PLAN:  Aortogram with left lower extremity runoff possible intervention next week, October 16. I discussed with the patient and his family member that I do not believe he is a very reasonable candidate for an open operation. However if we can do a percutaneous procedure with angioplasty and stenting this may lead to limb salvage for his left lower extremity. Otherwise I believe he is at risk for a left above-knee amputation. We will stop his Coumadin on the 12th.  Fabienne Bruns, MD [facility] [place] Office: [phone] Pager: [phone]

## 2012-03-10 ENCOUNTER — Telehealth: Payer: Self-pay

## 2012-03-10 NOTE — Telephone Encounter (Signed)
Phone call to Dr. Annitta Jersey office to make him aware that pt. has been instructed to hold his Coumadin 03/11/12-03/15/12.  Pt. Is scheduled for abdominal aortogram with bilateral runoff, possible intervention on 10/16.  Requesting approval from Dr. Sharyn Lull for pt. to hold Coumadin.  Is there a need for Lovenox bridge?

## 2012-03-10 NOTE — Telephone Encounter (Signed)
Will fax note to Dr. Sharyn Lull @ [phone]

## 2012-03-14 ENCOUNTER — Encounter (HOSPITAL_COMMUNITY): Payer: Self-pay | Admitting: General Practice

## 2012-03-14 ENCOUNTER — Inpatient Hospital Stay (HOSPITAL_COMMUNITY)
Admission: RE | Admit: 2012-03-14 | Discharge: 2012-03-16 | DRG: 301 | Disposition: A | Discharge status: Home Health | Payer: Medicare Other | Source: Ambulatory Visit | Attending: Vascular Surgery | Admitting: Vascular Surgery

## 2012-03-14 DIAGNOSIS — I739 Peripheral vascular disease, unspecified: Secondary | ICD-10-CM

## 2012-03-14 DIAGNOSIS — Z7901 Long term (current) use of anticoagulants: Secondary | ICD-10-CM

## 2012-03-14 DIAGNOSIS — I10 Essential (primary) hypertension: Secondary | ICD-10-CM | POA: Diagnosis present

## 2012-03-14 DIAGNOSIS — S78119A Complete traumatic amputation at level between unspecified hip and knee, initial encounter: Secondary | ICD-10-CM

## 2012-03-14 DIAGNOSIS — I70229 Atherosclerosis of native arteries of extremities with rest pain, unspecified extremity: Principal | ICD-10-CM | POA: Diagnosis present

## 2012-03-14 DIAGNOSIS — I509 Heart failure, unspecified: Secondary | ICD-10-CM | POA: Diagnosis present

## 2012-03-14 DIAGNOSIS — Z87891 Personal history of nicotine dependence: Secondary | ICD-10-CM

## 2012-03-14 DIAGNOSIS — Z86718 Personal history of other venous thrombosis and embolism: Secondary | ICD-10-CM

## 2012-03-14 DIAGNOSIS — I4891 Unspecified atrial fibrillation: Secondary | ICD-10-CM | POA: Diagnosis present

## 2012-03-14 DIAGNOSIS — Z8546 Personal history of malignant neoplasm of prostate: Secondary | ICD-10-CM

## 2012-03-14 DIAGNOSIS — M79609 Pain in unspecified limb: Secondary | ICD-10-CM

## 2012-03-14 DIAGNOSIS — Z79899 Other long term (current) drug therapy: Secondary | ICD-10-CM

## 2012-03-14 HISTORY — DX: Malignant (primary) neoplasm, unspecified: C80.1

## 2012-03-14 LAB — COMPREHENSIVE METABOLIC PANEL
ALT: 18 U/L (ref 0–53)
AST: 24 U/L (ref 0–37)
Alkaline Phosphatase: 91 U/L (ref 39–117)
Calcium: 9.9 mg/dL (ref 8.4–10.5)
GFR calc Af Amer: 54 mL/min — ABNORMAL LOW (ref >90)
Glucose, Bld: 171 mg/dL — ABNORMAL HIGH (ref 70–99)
Potassium: 3.7 mEq/L (ref 3.5–5.1)
Sodium: 139 mEq/L (ref 135–145)
Total Protein: 8.3 g/dL (ref 6.0–8.3)

## 2012-03-14 LAB — URINE MICROSCOPIC-ADD ON

## 2012-03-14 LAB — URINALYSIS, ROUTINE W REFLEX MICROSCOPIC
Glucose, UA: 100 mg/dL — AB
Specific Gravity, Urine: 1.018 (ref 1.005–1.030)
pH: 7 (ref 5.0–8.0)

## 2012-03-14 LAB — CBC
Hemoglobin: 15.8 g/dL (ref 13.0–17.0)
MCH: 30.8 pg (ref 26.0–34.0)
MCHC: 34.4 g/dL (ref 30.0–36.0)
Platelets: 240 10*3/uL (ref 150–400)
RBC: 5.13 MIL/uL (ref 4.22–5.81)

## 2012-03-14 MED ORDER — PHENOL 1.4 % MT LIQD
1.0000 | OROMUCOSAL | Status: DC | PRN
  Filled 2012-03-14: qty 177

## 2012-03-14 MED ORDER — SODIUM CHLORIDE 0.9 % IV SOLN
250.0000 mL | INTRAVENOUS | Status: DC | PRN
  Administered 2012-03-14: 250 mL via INTRAVENOUS

## 2012-03-14 MED ORDER — OXYCODONE-ACETAMINOPHEN 5-325 MG PO TABS: 1.0000 | ORAL_TABLET | ORAL | Status: DC | PRN

## 2012-03-14 MED ORDER — ACETAMINOPHEN 325 MG PO TABS: 325.0000 mg | ORAL_TABLET | ORAL | Status: DC | PRN

## 2012-03-14 MED ORDER — HEPARIN BOLUS VIA INFUSION
2000.0000 [IU] | Freq: Once | INTRAVENOUS | Status: AC
  Administered 2012-03-14: 2000 [IU] via INTRAVENOUS
  Filled 2012-03-14: qty 2000

## 2012-03-14 MED ORDER — SENNA 8.6 MG PO TABS
1.0000 | ORAL_TABLET | Freq: Two times a day (BID) | ORAL | Status: DC
  Administered 2012-03-14 – 2012-03-15 (×3): 8.6 mg via ORAL
  Filled 2012-03-14 (×5): qty 1

## 2012-03-14 MED ORDER — GUAIFENESIN-DM 100-10 MG/5ML PO SYRP: 15.0000 mL | ORAL_SOLUTION | ORAL | Status: DC | PRN

## 2012-03-14 MED ORDER — SPIRONOLACTONE 25 MG PO TABS
25.0000 mg | ORAL_TABLET | Freq: Every day | ORAL | Status: DC
  Administered 2012-03-14 – 2012-03-16 (×3): 25 mg via ORAL
  Filled 2012-03-14 (×3): qty 1

## 2012-03-14 MED ORDER — HEPARIN (PORCINE) IN NACL 100-0.45 UNIT/ML-% IJ SOLN
1150.0000 [IU]/h | INTRAMUSCULAR | Status: DC
  Administered 2012-03-14: 1150 [IU]/h via INTRAVENOUS
  Filled 2012-03-14 (×4): qty 250

## 2012-03-14 MED ORDER — METOPROLOL TARTRATE 1 MG/ML IV SOLN: 2.0000 mg | INTRAVENOUS | Status: DC | PRN

## 2012-03-14 MED ORDER — HYDRALAZINE HCL 20 MG/ML IJ SOLN
10.0000 mg | INTRAMUSCULAR | Status: DC | PRN
  Filled 2012-03-14: qty 0.5

## 2012-03-14 MED ORDER — RAMIPRIL 2.5 MG PO CAPS
2.5000 mg | ORAL_CAPSULE | Freq: Every day | ORAL | Status: DC
  Administered 2012-03-14 – 2012-03-16 (×3): 2.5 mg via ORAL
  Filled 2012-03-14 (×3): qty 1

## 2012-03-14 MED ORDER — ACETAMINOPHEN 650 MG RE SUPP: 325.0000 mg | RECTAL | Status: DC | PRN

## 2012-03-14 MED ORDER — LABETALOL HCL 5 MG/ML IV SOLN
10.0000 mg | INTRAVENOUS | Status: DC | PRN
  Filled 2012-03-14: qty 4

## 2012-03-14 MED ORDER — SODIUM CHLORIDE 0.9 % IJ SOLN: 3.0000 mL | INTRAMUSCULAR | Status: DC | PRN

## 2012-03-14 MED ORDER — DILTIAZEM HCL ER 240 MG PO CP24
240.0000 mg | ORAL_CAPSULE | Freq: Every day | ORAL | Status: DC
  Administered 2012-03-14 – 2012-03-16 (×3): 240 mg via ORAL
  Filled 2012-03-14 (×4): qty 1

## 2012-03-14 MED ORDER — POTASSIUM CHLORIDE CRYS ER 20 MEQ PO TBCR
20.0000 meq | EXTENDED_RELEASE_TABLET | Freq: Once | ORAL | Status: AC
  Administered 2012-03-14: 20 meq via ORAL
  Filled 2012-03-14: qty 1

## 2012-03-14 MED ORDER — SODIUM CHLORIDE 0.9 % IJ SOLN
3.0000 mL | Freq: Two times a day (BID) | INTRAMUSCULAR | Status: DC
  Administered 2012-03-14 – 2012-03-16 (×2): 3 mL via INTRAVENOUS

## 2012-03-14 MED ORDER — PANTOPRAZOLE SODIUM 40 MG PO TBEC
40.0000 mg | DELAYED_RELEASE_TABLET | Freq: Every day | ORAL | Status: DC
  Administered 2012-03-14 – 2012-03-16 (×3): 40 mg via ORAL
  Filled 2012-03-14 (×3): qty 1

## 2012-03-14 MED ORDER — ONDANSETRON HCL 4 MG/2ML IJ SOLN: 4.0000 mg | Freq: Four times a day (QID) | INTRAMUSCULAR | Status: DC | PRN

## 2012-03-14 NOTE — Progress Notes (Signed)
ANTICOAGULATION CONSULT NOTE - Follow UP Consult  Pharmacy Consult for Heparin Indication: atrial fibrillation  No Known Allergies  Patient Measurements: Height: 5\' 11"  (180.3 cm) Weight: 177 lb 11.1 oz (80.6 kg) IBW/kg (Calculated) : 75.3  Heparin Dosing Weight: 80.6 kg  Vital Signs: Temp: 98.1 F (36.7 C) (10/15 1956) Temp src: Oral (10/15 1956) BP: 135/82 mmHg (10/15 1956) Pulse Rate: 89  (10/15 1956)  Labs:  [facility] 03/14/12 2206 03/14/12 1318  HGB -- 15.8  HCT -- 45.9  PLT -- 240  APTT -- --  LABPROT -- 17.5*  INR -- 1.48  HEPARINUNFRC 0.39 --  CREATININE -- 1.40*  CKTOTAL -- --  CKMB -- --  TROPONINI -- --    Estimated Creatinine Clearance: 45.6 ml/min (by C-G formula based on Cr of 1.4).   Medical History: Past Medical History  Diagnosis Date  . Hypertension   . Atrial fibrillation   . Cataract   . Peripheral vascular disease   . DVT (deep venous thrombosis)   . CHF (congestive heart failure)   . Cancer     prostate CA    Medications:  Prescriptions prior to admission  Medication Sig Dispense Refill  . diltiazem (DILACOR XR) 240 MG 24 hr capsule Take 240 mg by mouth daily.      Marland Kitchen omeprazole (PRILOSEC) 20 MG capsule Take 20 mg by mouth daily.      . ramipril (ALTACE) 2.5 MG capsule Take 2.5 mg by mouth daily.      Marland Kitchen spironolactone (ALDACTONE) 25 MG tablet Take 25 mg by mouth daily.      Marland Kitchen warfarin (COUMADIN) 5 MG tablet Take 5 mg by mouth daily.        Assessment: 78 yo male with PVD and previous R AKA admitted for evaluation of L foot ischemia. Patient is on chronic Coumadin for Afib which is currently being held for left leg angioplasty/stenting tomorrow. Pharmacy consulted to begin heparin while Coumadin is on hold. INR is subtherapeutic. CBC is normal.   Heparin level reported at goal.  Will recheck with am labs.  Goal of Therapy:  Heparin level 0.3-0.7 units/ml Monitor platelets by anticoagulation protocol: Yes   Plan:  -Continue  heparin at 1150 units/hr -Daily heparin level and CBC while on heparin   Thank you for allowing pharmacy to be a part of this patients care team.  Lovenia Kim Pharm.D., BCPS Clinical Pharmacist 03/14/2012 10:48 PM Pager: [phone] Phone: [phone]

## 2012-03-14 NOTE — Progress Notes (Signed)
Utilization Review Completed.Cristian Miller T10/15/2013   

## 2012-03-14 NOTE — Progress Notes (Signed)
Pt to be admitted for Heparin bridge 03/14/12 Angiogram 03/15/12 by Dr Darrick Penna

## 2012-03-14 NOTE — Addendum Note (Signed)
Addended by: Marlowe Shores on: 03/14/2012 12:05 PM   Modules accepted: Orders

## 2012-03-14 NOTE — Progress Notes (Signed)
ANTICOAGULATION CONSULT NOTE - Initial Consult  Pharmacy Consult for Heparin Indication: atrial fibrillation  No Known Allergies  Patient Measurements: Height: 5\' 11"  (180.3 cm) Weight: 177 lb 11.1 oz (80.6 kg) IBW/kg (Calculated) : 75.3  Heparin Dosing Weight: 80.6 kg  Vital Signs: Temp: 97.7 F (36.5 C) (10/15 1400) Temp src: Oral (10/15 1400) BP: 170/117 mmHg (10/15 1400) Pulse Rate: 93  (10/15 1400)  Labs:  [facility] 03/14/12 1318  HGB 15.8  HCT 45.9  PLT 240  APTT --  LABPROT 17.5*  INR 1.48  HEPARINUNFRC --  CREATININE 1.40*  CKTOTAL --  CKMB --  TROPONINI --    Estimated Creatinine Clearance: 45.6 ml/min (by C-G formula based on Cr of 1.4).   Medical History: Past Medical History  Diagnosis Date  . Hypertension   . Atrial fibrillation   . Cataract   . Peripheral vascular disease   . DVT (deep venous thrombosis)   . CHF (congestive heart failure)   . Cancer     prostate CA    Medications:  Prescriptions prior to admission  Medication Sig Dispense Refill  . diltiazem (DILACOR XR) 240 MG 24 hr capsule Take 240 mg by mouth daily.      Marland Kitchen omeprazole (PRILOSEC) 20 MG capsule Take 20 mg by mouth daily.      . ramipril (ALTACE) 2.5 MG capsule Take 2.5 mg by mouth daily.      Marland Kitchen spironolactone (ALDACTONE) 25 MG tablet Take 25 mg by mouth daily.      Marland Kitchen warfarin (COUMADIN) 5 MG tablet Take 5 mg by mouth daily.        Assessment: 78 yo male with PVD and previous R AKA admitted for evaluation of L foot ischemia. Patient is on chronic Coumadin for Afib which is currently being held for left leg angioplasty/stenting tomorrow. Pharmacy consulted to begin heparin while Coumadin is on hold. INR is subtherapeutic. CBC is normal.   Goal of Therapy:  Heparin level 0.3-0.7 units/ml Monitor platelets by anticoagulation protocol: Yes   Plan:  -Heparin 2000 units IV bolus then 1150 units/hr -Heparin level 6 hours after started -Daily heparin level and CBC while on  heparin   [facility], Colerain.D., BCPS Clinical Pharmacist Pager: [phone] 03/14/2012 2:17 PM

## 2012-03-14 NOTE — H&P (Signed)
[facility] [place]  HISTORY AND PHYSICAL  History of Present Illness: Patient is a 78 y.o. year old male who presents for evaluation of ischemia left foot. The patient previously underwent emergency iliac thrombectomy and axbifem and 2009. He subsequently occluded the axbifem. Discussions were held regarding a possible right above-knee amputation at that time. The patient ultimately went to the [facility] for a second opinion. He had a right above-knee amputation in 2009 at the [facility]. He has not been seen in followup since then. Other medical problems include atrial fibrillation, hypertension, some element of dementia, congestive heart failure. The patient was incompetent make medical decisions when he was seen in the hospital by Dr. Jeanie Sewer in 2009. His family member that is with him today states that she is currently making decisions for him medically with his consultation. He is non-ambulatory. He does use his left leg to transfer. He is able to do some daily activities. He lives with another family member. He denies rest pain in the left foot. He has no ulcerations in the left foot. He was sent by Dr. Sharyn Lull but the patient or the family member do not know the exact reason why he was sent.  Past Medical History   Diagnosis  Date   .  Hypertension    .  Atrial fibrillation    .  Cataract    .  Peripheral vascular disease    .  DVT (deep venous thrombosis)    .  CHF (congestive heart failure)     Past Surgical History   Procedure  Date   .  Leg amputation    .  Cataract extraction    .  Pr vein bypass graft,aorto-fem-pop      2009 right axillary bifemoral bypass and bilateral iliofemoral thrombectomies   Social History  History   Substance Use Topics   .  Smoking status:  Former Smoker     Quit date:  06/01/1991   .  Smokeless tobacco:  Not on file   .  Alcohol Use:  No   Family History  History reviewed. No pertinent family history.  Allergies  No Known  Allergies  Current Outpatient Prescriptions   Medication  Sig  Dispense  Refill   .  diltiazem (DILACOR XR) 240 MG 24 hr capsule  Take 240 mg by mouth daily.     Marland Kitchen  omeprazole (PRILOSEC) 20 MG capsule  Take 20 mg by mouth daily.     .  ramipril (ALTACE) 2.5 MG capsule  Take 2.5 mg by mouth daily.     Bertram Gala Glycol-Propyl Glycol (SYSTANE) 0.4-0.3 % SOLN  Apply 1 drop to eye 4 (four) times daily as needed.  5 mL  0   .  warfarin (COUMADIN) 1 MG tablet  Take 1 mg by mouth as directed.     ROS:  General: No weight loss, Fever, chills  HEENT: No recent headaches, no nasal bleeding, no visual changes, no sore throat  Neurologic: No dizziness, blackouts, seizures. No recent symptoms of stroke or mini- stroke. No recent episodes of slurred speech, or temporary blindness.  Cardiac: No recent episodes of chest pain/pressure, no shortness of breath at rest. No shortness of breath with exertion. Denies history of atrial fibrillation or irregular heartbeat  Vascular: No history of rest pain in feet. No history of claudication. No history of non-healing ulcer, No history of DVT  Pulmonary: No home oxygen, no productive cough, no hemoptysis, No  asthma or wheezing  Musculoskeletal: [ ]  Arthritis, [ ]  Low back pain, [ ]  Joint pain  Hematologic:No history of hypercoagulable state. No history of easy bleeding. No history of anemia  Gastrointestinal: No hematochezia or melena, No gastroesophageal reflux, no trouble swallowing  Urinary: [ ]  chronic Kidney disease, [ ]  on HD - [ ]  MWF or [ ]  TTHS, [ ]  Burning with urination, [ ]  Frequent urination, [ ]  Difficulty urinating;  Skin: No rashes  Psychological: No history of anxiety, No history of depression  Physical Examination  Filed Vitals:    03/09/12 1253   BP:  150/101   Pulse:  95   Resp:  18   Height:  5\' 6"  (1.676 m)   Weight:  180 lb (81.647 kg)   Body mass index is 29.05 kg/(m^2).  General: Alert and oriented, no acute distress, slow to answer  some questions  HEENT: Normal  Neck: No bruit or JVD  Pulmonary: Clear to auscultation bilaterally  Cardiac: Irregularly irregular without murmur  Abdomen: Soft, non-tender, non-distended  Skin: No rash  Extremity Pulses: 2+ radial, brachial, femoral, absent popliteal dorsalis pedis, posterior tibial pulses left leg  Musculoskeletal: Well-healed right above-knee amputation no left lower extremity edema  Neurologic: Upper and lower extremity motor 5/5 and symmetric  DATA: The patient had a left lower extremity ABI today which showed no flow in the peroneal posterior tibial or dorsalis pedis artery and a digit pressure; he did have monophasic flow in the left popliteal artery. I reviewed and interpreted this study.  ASSESSMENT: Asymptomatic but severe ischemia by Doppler exam today left leg.  PLAN: Aortogram with left lower extremity runoff possible intervention next week, October 16. I discussed with the patient and his family member that I do not believe he is a very reasonable candidate for an open operation. However if we can do a percutaneous procedure with angioplasty and stenting this may lead to limb salvage for his left lower extremity. Otherwise I believe he is at risk for a left above-knee amputation. We will stop his Coumadin on the 12th. We will admit him for heparin bridge 03/14/12.  Fabienne Bruns, MD  [facility] [place]  Office: [phone]  Pager: [phone]

## 2012-03-15 ENCOUNTER — Encounter (HOSPITAL_COMMUNITY)
Admission: RE | Disposition: A | Discharge status: Home / Self Care | Payer: Self-pay | Source: Ambulatory Visit | Attending: Vascular Surgery

## 2012-03-15 DIAGNOSIS — I70229 Atherosclerosis of native arteries of extremities with rest pain, unspecified extremity: Secondary | ICD-10-CM

## 2012-03-15 HISTORY — PX: ABDOMINAL AORTAGRAM: SHX5454

## 2012-03-15 LAB — CBC
HCT: 43.3 % (ref 39.0–52.0)
Hemoglobin: 14.8 g/dL (ref 13.0–17.0)
MCH: 30.2 pg (ref 26.0–34.0)
MCHC: 34.2 g/dL (ref 30.0–36.0)
MCV: 88.4 fL (ref 78.0–100.0)

## 2012-03-15 LAB — PROTIME-INR: INR: 1.48 (ref 0.00–1.49)

## 2012-03-15 SURGERY — ABDOMINAL AORTAGRAM
Anesthesia: LOCAL

## 2012-03-15 MED ORDER — ACETAMINOPHEN 325 MG PO TABS: 325.0000 mg | ORAL_TABLET | ORAL | Status: DC | PRN

## 2012-03-15 MED ORDER — ENOXAPARIN (LOVENOX) PATIENT EDUCATION KIT
PACK | Freq: Once | Status: AC
  Administered 2012-03-15: 16:00:00
  Filled 2012-03-15: qty 1

## 2012-03-15 MED ORDER — HYDRALAZINE HCL 20 MG/ML IJ SOLN
10.0000 mg | INTRAMUSCULAR | Status: DC | PRN
  Filled 2012-03-15: qty 0.5

## 2012-03-15 MED ORDER — LABETALOL HCL 5 MG/ML IV SOLN
10.0000 mg | INTRAVENOUS | Status: DC | PRN
  Filled 2012-03-15: qty 4

## 2012-03-15 MED ORDER — ACETAMINOPHEN 650 MG RE SUPP: 325.0000 mg | RECTAL | Status: DC | PRN

## 2012-03-15 MED ORDER — WARFARIN SODIUM 10 MG PO TABS
10.0000 mg | ORAL_TABLET | Freq: Once | ORAL | Status: AC
  Administered 2012-03-15: 10 mg via ORAL
  Filled 2012-03-15: qty 1

## 2012-03-15 MED ORDER — LIDOCAINE HCL (PF) 1 % IJ SOLN
INTRAMUSCULAR | Status: AC
  Filled 2012-03-15: qty 30

## 2012-03-15 MED ORDER — WARFARIN SODIUM 5 MG PO TABS: 5.0000 mg | ORAL_TABLET | Freq: Every day | ORAL | Status: DC

## 2012-03-15 MED ORDER — HEPARIN (PORCINE) IN NACL 100-0.45 UNIT/ML-% IJ SOLN
1150.0000 [IU]/h | INTRAMUSCULAR | Status: DC
  Filled 2012-03-15: qty 250

## 2012-03-15 MED ORDER — ONDANSETRON HCL 4 MG/2ML IJ SOLN: 4.0000 mg | Freq: Four times a day (QID) | INTRAMUSCULAR | Status: DC | PRN

## 2012-03-15 MED ORDER — ENOXAPARIN SODIUM 80 MG/0.8ML ~~LOC~~ SOLN
80.0000 mg | Freq: Two times a day (BID) | SUBCUTANEOUS | Status: DC
  Administered 2012-03-15 – 2012-03-16 (×2): 80 mg via SUBCUTANEOUS
  Filled 2012-03-15 (×5): qty 0.8

## 2012-03-15 MED ORDER — HEPARIN (PORCINE) IN NACL 2-0.9 UNIT/ML-% IJ SOLN
INTRAMUSCULAR | Status: AC
  Filled 2012-03-15: qty 1000

## 2012-03-15 MED ORDER — SODIUM CHLORIDE 0.45 % IV SOLN
INTRAVENOUS | Status: DC
  Administered 2012-03-15: 13:00:00 via INTRAVENOUS

## 2012-03-15 MED ORDER — WARFARIN - PHYSICIAN DOSING INPATIENT: Freq: Every day | Status: DC

## 2012-03-15 NOTE — Care Management Note (Signed)
    Page 1 of 2   03/16/2012     3:46:30 PM   CARE MANAGEMENT NOTE 03/16/2012  Patient:  Cristian Miller, Cristian Miller   Account Number:  [id number]  Date Initiated:  03/15/2012  Documentation initiated by:  Pershing Skidmore  Subjective/Objective Assessment:   PT S/P AORTOGRAM WITH LLE RUNOFF TODAY IN CATH LAB.  PTA, PT HAS HIRED CAREGIVER WHO PROVIDES ASSISTANCE DAILY.  HE HAS A RT AKA, AND USES A WHEELCHAIR.     Action/Plan:   CM REFERRAL FOR POSSIBLE HOME LOVENOX.   Anticipated DC Date:  03/16/2012   Anticipated DC Plan:  HOME W HOME HEALTH SERVICES      DC Planning Services  CM consult      [facility] Choice  HOME HEALTH   Choice offered to / List presented to:  [name]        HH arranged  HH-1 RN      [facility] agency  [facility]   Status of service:  Completed, signed off Medicare Important Message given?   (If response is "NO", the following Medicare IM given date fields will be blank) Date Medicare IM given:   Date Additional Medicare IM given:    Discharge Disposition:  HOME W HOME HEALTH SERVICES  Per UR Regulation:  Reviewed for med. necessity/level of care/duration of stay  If discussed at Long Length of Stay Meetings, dates discussed:    Comments:  03/16/12 Etheleen Valtierra,RN,BSN [phone] [name].  LOVENOX DOSE CHANGED TO DAILY 120MG ; WILL ARRANGE HHRN TO ADMINISTER DAILY X 4-5 DAYS.  REFERRAL TO [facility], PER PT CHOICE.  PLAN HHRN TO DRAW PT/INR ON 10/21 WITH RESULTS TO DR HARWANI.  START OF CARE WITHIN 24H OF DC.   03/15/12 Cavon Nicolls,RN, BSN 1430 PT STATES HE HAS GIVEN HIMSELF LOVENOX INJECTIONS IN THE PAST, AND HAS NO PROBLEM DOING THIS AGAIN.  I ASKED IF HIS CAREGIVER COULD GIVE INJECTIONS, AND PT REPLIED, "OH NO, I WOULD HAVE TO DO THAT".  WILL CHECK INSURANCE COVERAGE FOR CURRENT DOSE OF LOVENOX.  WILL HAVE PT SELF-ADMINISTER INJECTIONS THIS EVENING AND IN AM TO ASSURE THAT HE IS SAFE TO GIVE  INJECTIONS.  BEDSIDE NURSE UPDATED.  DR FIELDS AWARE OF PLAN, AND IS AGREEABLE.

## 2012-03-15 NOTE — Interval H&P Note (Signed)
History and Physical Interval Note:  03/15/2012 10:44 AM  Cristian Miller  has presented today for surgery, with the diagnosis of PVD  The various methods of treatment have been discussed with the patient and family. After consideration of risks, benefits and other options for treatment, the patient has consented to  Procedure(s) (LRB) with comments: ABDOMINAL AORTAGRAM (N/A) as a surgical intervention .  The patient's history has been reviewed, patient examined, no change in status, stable for surgery.  I have reviewed the patient's chart and labs.  Questions were answered to the patient's satisfaction.     [name]

## 2012-03-15 NOTE — Op Note (Signed)
Procedure: Aortogram with left lower extremity runoff  Preoperative diagnosis: Rest pain left foot  Postoperative diagnosis: Same  Anesthesia local  Operative findings  : No flow in a named vessel below the profunda.  Popliteal and tibial vessels all occluded  Operative details: After obtaining informed consent, the patient was taken to the PV LAB. The patient was placed in supine position on the Angio table. Both groins were prepped and draped in usual sterile fashion. Local anesthesia was infiltrated over the left common femoral artery. An introducer needle was used to cannulate the right common femoral artery and 035 versacore wire threaded into the abdominal aorta under fluoroscopic guidance. Next a 5 French sheath is placed over the guidewire and the left common femoral artery. A 5 French pigtail catheter was placed over the guidewire into the abdominal aorta and abdominal aortogram was obtained. The infrarenal abdominal aorta is patent. The left and right renal arteries are patent.  The left and right common internal and external iliac arteries are patent. The catheter was then pulled up just above the aortic bifurcation and a runoff to the left leg was obtained.   Next a stepwise angiogram was performed of the left lower extremity.  This shows a patent common femoral profunda femoris artery. The SFA, popliteal, and all tibial vessels are occluded.  The 5 French sheath was left in place to be pulled in the holding area. The patient tolerated the procedure well and there were no complications. Patient was taken to the holding area in stable condition.  Operative management: No options for revascularization.  If the patient develops rest pain not controlled with oral pain medication progressive tissue loss or progressive non healing wounds he will need a left above knee amputation.  Fabienne Bruns, MD [facility] [place] Office: [phone] Pager: [phone]

## 2012-03-15 NOTE — Progress Notes (Addendum)
ANTICOAGULATION CONSULT NOTE - Follow Up Consult  Pharmacy Consult for Heparin Indication: atrial fibrillation  No Known Allergies  Patient Measurements: Height: 5\' 11"  (180.3 cm) Weight: 177 lb 11.1 oz (80.6 kg) IBW/kg (Calculated) : 75.3  Heparin Dosing Weight: 80.6 kg  Vital Signs: Temp: 98.1 F (36.7 C) (10/16 1348) Temp src: Oral (10/16 0619) BP: 144/74 mmHg (10/16 1348) Pulse Rate: 88  (10/16 1348)  Labs:  Basename 03/15/12 0530 03/14/12 2206 03/14/12 1318  HGB 14.8 -- 15.8  HCT 43.3 -- 45.9  PLT 226 -- 240  APTT -- -- --  LABPROT 17.5* -- 17.5*  INR 1.48 -- 1.48  HEPARINUNFRC <0.10* 0.39 --  CREATININE -- -- 1.40*  CKTOTAL -- -- --  CKMB -- -- --  TROPONINI -- -- --    Estimated Creatinine Clearance: 45.6 ml/min (by C-G formula based on Cr of 1.4).   Medications:  Infusions:    . sodium chloride 75 mL/hr at 03/15/12 1308  . heparin    . DISCONTD: heparin Stopped (03/15/12 0034)    Assessment: 78 yo male with PVD and previous R AKA admitted for evaluation of L foot ischemia. Patient is on chronic Coumadin for Afib which is currently being held. Pharmacy consulted to resume heparin s/p aortogram. Last heparin level on 1150 units/hr was therapeutic. No bleeding noted, CBC is stable.   Goal of Therapy:  Heparin level 0.3-0.7 units/ml Monitor platelets by anticoagulation protocol: Yes   Plan:  -Resume heparin drip at 1150 units/hr at 19:00 -Heparin level 8 hours after started -Daily heparin level and CBC while on heparin -Please advise when safe to resume Coumadin   [facility], [place].D., BCPS Clinical Pharmacist Pager: [phone] 03/15/2012 2:09 PM  Now switching from heparin to lovenox so patient can go home tomorrow.  Plan: -Lovenox 80mg  sq bid (first dose at 1900 tonight) -RN to observe patient to make sure he can self administer -Lovenox education kit  Gentry Roch Clinical Pharmacist Pager: [phone] 03/15/2012 2:39  PM

## 2012-03-15 NOTE — Progress Notes (Signed)
Heparin drip D/C per MD order,will continue plan of care.

## 2012-03-16 LAB — CBC
HCT: 45.1 % (ref 39.0–52.0)
MCV: 89.5 fL (ref 78.0–100.0)
Platelets: 231 10*3/uL (ref 150–400)
RBC: 5.04 MIL/uL (ref 4.22–5.81)
WBC: 9.2 10*3/uL (ref 4.0–10.5)

## 2012-03-16 MED ORDER — ENOXAPARIN SODIUM 80 MG/0.8ML ~~LOC~~ SOLN: 120.0000 mg | Freq: Every day | SUBCUTANEOUS | Status: DC

## 2012-03-16 MED ORDER — ENOXAPARIN SODIUM 120 MG/0.8ML ~~LOC~~ SOLN: 120.0000 mg | Freq: Every day | SUBCUTANEOUS | Status: DC

## 2012-03-16 MED ORDER — OXYCODONE-ACETAMINOPHEN 5-325 MG PO TABS: 1.0000 | ORAL_TABLET | ORAL | Status: DC | PRN

## 2012-03-16 NOTE — Discharge Summary (Signed)
[facility] Discharge Summary   Patient ID:  Cristian Miller MRN: [id number] DOB/AGE: December 22, 1930 78 y.o.  Admit date: 03/14/2012 Discharge date: 03/16/2012 Date of Surgery: 03/14/2012 - 03/15/2012 Surgeon: Moishe Spice): Sherren Kerns, MD  Admission Diagnosis: PVD IV Coagulation  Discharge Diagnoses:  PVD IV Coagulation  Secondary Diagnoses: Past Medical History  Diagnosis Date  . Hypertension   . Atrial fibrillation   . Cataract   . Peripheral vascular disease   . DVT (deep venous thrombosis)   . CHF (congestive heart failure)   . Cancer     prostate CA    Procedure(s): ABDOMINAL AORTAGRAM ANGIOGRAM EXTREMITY LEFT  Discharged Condition: good  HPI:  Patient is a 78 y.o. year old male who presents for evaluation of ischemia left foot. The patient previously underwent emergency iliac thrombectomy and axbifem and 2009. He subsequently occluded the axbifem. Discussions were held regarding a possible right above-knee amputation at that time. The patient ultimately went to the [facility] for a second opinion. He had a right above-knee amputation in 2009 at the [facility]. He has not been seen in followup since then. Other medical problems include atrial fibrillation, hypertension, some element of dementia, congestive heart failure. The patient was incompetent make medical decisions when he was seen in the hospital by Dr. Jeanie Sewer in 2009. His family member that is with him today states that she is currently making decisions for him medically with his consultation. He is non-ambulatory. He does use his left leg to transfer. He is able to do some daily activities. He lives with another family member. He denies rest pain in the left foot. He has no ulcerations in the left foot. He was sent by Dr. Sharyn Lull. PLAN: Aortogram with left lower extremity runoff possible intervention, October 16.    Hospital Course:  Cristian Miller is a 78 y.o. male is S/P   Procedure(s): ABDOMINAL AORTAGRAM ANGIOGRAM EXTREMITY LEFT Post-op wounds healing well Pt. Ambulating, voiding and taking PO diet without difficulty. Pt pain controlled with PO pain meds. Labs as below Complications:none  Consults:     Significant Diagnostic Studies: CBC Lab Results  Component Value Date   WBC 9.2 03/16/2012   HGB 14.9 03/16/2012   HCT 45.1 03/16/2012   MCV 89.5 03/16/2012   PLT 231 03/16/2012    BMET    Component Value Date/Time   NA 139 03/14/2012 1318   K 3.7 03/14/2012 1318   CL 104 03/14/2012 1318   CO2 26 03/14/2012 1318   GLUCOSE 171* 03/14/2012 1318   BUN 15 03/14/2012 1318   CREATININE 1.40* 03/14/2012 1318   CALCIUM 9.9 03/14/2012 1318   GFRNONAA 46* 03/14/2012 1318   GFRAA 54* 03/14/2012 1318   COAG Lab Results  Component Value Date   INR 1.40 03/16/2012   INR 1.48 03/15/2012   INR 1.48 03/14/2012     Disposition:  Discharge to :Home Discharge Orders    Future Orders Please Complete By Expires   Resume previous diet      Driving Restrictions      Comments:   No driving for 4 weeks   Call MD for:  temperature >100.5      Call MD for:  redness, tenderness, or signs of infection (pain, swelling, bleeding, redness, odor or green/yellow discharge around incision site)      Call MD for:  severe or increased pain, loss or decreased feeling  in affected limb(s)      Walk with assistance  Fidela Juneau  Home Medication Instructions ZOX:[id number]   Printed on:03/16/12 1319  Medication Information                    ramipril (ALTACE) 2.5 MG capsule Take 2.5 mg by mouth daily.           omeprazole (PRILOSEC) 20 MG capsule Take 20 mg by mouth daily.           diltiazem (DILACOR XR) 240 MG 24 hr capsule Take 240 mg by mouth daily.           spironolactone (ALDACTONE) 25 MG tablet Take 25 mg by mouth daily.           warfarin (COUMADIN) 5 MG tablet Take 5 mg by mouth daily.           enoxaparin (LOVENOX) 120  MG/0.8ML injection Inject 0.8 mLs (120 mg total) into the skin daily.            Verbal and written Discharge instructions given to the patient. Wound care per Discharge AVS Follow-up Information    Follow up with Fabienne Bruns E, MD. In 2 weeks. (office will arrange)    Contact information:   [address] [place] [address] [phone]       Call Robynn Pane, MD. (need to go to clinic for lab draw monday 10/21)    Contact information:   [address] [place] [address] [phone]          Signed: Marlowe Shores 03/16/2012, 1:19 PM

## 2012-03-16 NOTE — Progress Notes (Signed)
No complaints, pt was unable to safely give himself lovenox injections  Exam: Right groin no hematoma Left foot no pain  Assessment: Left leg PAD stable Needs bridge until coumadin therapeutic Will see if we can arrange home health to assist with Lovenox otherwise he will need to remain inpt until INR >2  Fabienne Bruns, MD [facility] [place] Office: [phone] Pager: [phone]

## 2012-03-16 NOTE — Progress Notes (Signed)
Teaching done on Lovenox injections with pt last evening and this am.  Pt. Stated he had done injections in the past, but wanted to observe pt.  Pt. Attempted x 2 to do injection last pm and this am, but without hands-on assistance would have stuck his finger.  Pt pulled needle out and re-stuck himself this am and was not even sticking area he cleaned and was holding.  I don't think his vision is well enough to perform this task.

## 2012-04-05 ENCOUNTER — Telehealth: Payer: Self-pay | Admitting: Vascular Surgery

## 2012-04-05 NOTE — Telephone Encounter (Signed)
l/v/m notified patient of fu with cef on 04-05-12 at 10:15

## 2012-04-06 ENCOUNTER — Ambulatory Visit: Payer: Medicare Other | Admitting: Vascular Surgery

## 2012-04-12 ENCOUNTER — Encounter: Payer: Self-pay | Admitting: Vascular Surgery

## 2012-04-13 ENCOUNTER — Ambulatory Visit (INDEPENDENT_AMBULATORY_CARE_PROVIDER_SITE_OTHER): Payer: Medicare Other | Admitting: Vascular Surgery

## 2012-04-13 ENCOUNTER — Encounter: Payer: Self-pay | Admitting: Vascular Surgery

## 2012-04-13 VITALS — BP 125/68 | HR 82 | Temp 98.3°F | Ht 66.0 in | Wt 180.0 lb

## 2012-04-13 DIAGNOSIS — I739 Peripheral vascular disease, unspecified: Secondary | ICD-10-CM

## 2012-04-13 NOTE — Progress Notes (Signed)
Patient is a 78 year old male who returns for followup today after recent arteriogram. His arteriogram showed no reconstructable vessels. His common femoral and profunda femoris was opened however everything else in the left leg was occluded. He denies rest pain in left foot. He has no open wounds on the left foot.  Review of systems: He denies shortness of breath. He denies chest pain.  Physical exam: Filed Vitals:   04/13/12 0900  BP: 125/68  Pulse: 82  Temp: 98.3 F (36.8 C)  TempSrc: Oral  Height: 5\' 6"  (1.676 m)  Weight: 180 lb (81.647 kg)  SpO2: 100%   Left lower extremity dry scaly feet with length and toenails thickened No palpable pulses Foot is warm Skin no rash or ulcer  Assessment: Unreconstructable severe arterial occlusive disease left lower extremity. No options for revascularization. Patient will followup on as-needed basis if he develops unrelenting rest pain or nonhealing wound for consideration of above-knee amputation.  I spoke with the patient's family and the patient directly today about seen a podiatrist for trimming back his toenails to reduce risk of injury. He states he will schedule this on his own.  Plan: See above  Fabienne Bruns, MD [facility] [place] Office: [phone] Pager: [phone]

## 2013-04-18 ENCOUNTER — Encounter (HOSPITAL_COMMUNITY): Payer: Self-pay | Admitting: Emergency Medicine

## 2013-04-18 ENCOUNTER — Emergency Department (HOSPITAL_COMMUNITY): Payer: Medicare Other

## 2013-04-18 ENCOUNTER — Inpatient Hospital Stay (HOSPITAL_COMMUNITY)
Admission: EM | Admit: 2013-04-18 | Discharge: 2013-04-23 | DRG: 392 | Disposition: A | Discharge status: Home Health | Payer: Medicare Other | Attending: Cardiology | Admitting: Cardiology

## 2013-04-18 DIAGNOSIS — N183 Chronic kidney disease, stage 3 unspecified: Secondary | ICD-10-CM | POA: Diagnosis present

## 2013-04-18 DIAGNOSIS — R066 Hiccough: Secondary | ICD-10-CM | POA: Diagnosis not present

## 2013-04-18 DIAGNOSIS — I5022 Chronic systolic (congestive) heart failure: Secondary | ICD-10-CM | POA: Diagnosis present

## 2013-04-18 DIAGNOSIS — D72829 Elevated white blood cell count, unspecified: Secondary | ICD-10-CM | POA: Diagnosis present

## 2013-04-18 DIAGNOSIS — I129 Hypertensive chronic kidney disease with stage 1 through stage 4 chronic kidney disease, or unspecified chronic kidney disease: Secondary | ICD-10-CM | POA: Diagnosis present

## 2013-04-18 DIAGNOSIS — Z86718 Personal history of other venous thrombosis and embolism: Secondary | ICD-10-CM

## 2013-04-18 DIAGNOSIS — F0390 Unspecified dementia without behavioral disturbance: Secondary | ICD-10-CM | POA: Diagnosis present

## 2013-04-18 DIAGNOSIS — Z7901 Long term (current) use of anticoagulants: Secondary | ICD-10-CM

## 2013-04-18 DIAGNOSIS — S78119A Complete traumatic amputation at level between unspecified hip and knee, initial encounter: Secondary | ICD-10-CM

## 2013-04-18 DIAGNOSIS — I428 Other cardiomyopathies: Secondary | ICD-10-CM | POA: Diagnosis present

## 2013-04-18 DIAGNOSIS — N390 Urinary tract infection, site not specified: Secondary | ICD-10-CM | POA: Diagnosis present

## 2013-04-18 DIAGNOSIS — Z87891 Personal history of nicotine dependence: Secondary | ICD-10-CM

## 2013-04-18 DIAGNOSIS — E860 Dehydration: Secondary | ICD-10-CM | POA: Diagnosis present

## 2013-04-18 DIAGNOSIS — I4891 Unspecified atrial fibrillation: Secondary | ICD-10-CM

## 2013-04-18 DIAGNOSIS — I739 Peripheral vascular disease, unspecified: Secondary | ICD-10-CM | POA: Diagnosis present

## 2013-04-18 DIAGNOSIS — N179 Acute kidney failure, unspecified: Secondary | ICD-10-CM | POA: Diagnosis present

## 2013-04-18 DIAGNOSIS — K8020 Calculus of gallbladder without cholecystitis without obstruction: Secondary | ICD-10-CM | POA: Diagnosis present

## 2013-04-18 DIAGNOSIS — E119 Type 2 diabetes mellitus without complications: Secondary | ICD-10-CM | POA: Diagnosis present

## 2013-04-18 DIAGNOSIS — Z8546 Personal history of malignant neoplasm of prostate: Secondary | ICD-10-CM

## 2013-04-18 DIAGNOSIS — R1110 Vomiting, unspecified: Secondary | ICD-10-CM

## 2013-04-18 DIAGNOSIS — K5289 Other specified noninfective gastroenteritis and colitis: Principal | ICD-10-CM | POA: Diagnosis present

## 2013-04-18 DIAGNOSIS — I509 Heart failure, unspecified: Secondary | ICD-10-CM | POA: Diagnosis present

## 2013-04-18 HISTORY — DX: Elevated white blood cell count, unspecified: D72.829

## 2013-04-18 LAB — COMPREHENSIVE METABOLIC PANEL
ALT: 12 U/L (ref 0–53)
Alkaline Phosphatase: 88 U/L (ref 39–117)
BUN: 55 mg/dL — ABNORMAL HIGH (ref 6–23)
CO2: 21 mEq/L (ref 19–32)
Calcium: 10.3 mg/dL (ref 8.4–10.5)
Creatinine, Ser: 3.5 mg/dL — ABNORMAL HIGH (ref 0.50–1.35)
GFR calc Af Amer: 18 mL/min — ABNORMAL LOW (ref >90)
GFR calc non Af Amer: 15 mL/min — ABNORMAL LOW (ref >90)
Glucose, Bld: 270 mg/dL — ABNORMAL HIGH (ref 70–99)
Total Protein: 8.7 g/dL — ABNORMAL HIGH (ref 6.0–8.3)

## 2013-04-18 LAB — CBC WITH DIFFERENTIAL/PLATELET
Basophils Absolute: 0 K/uL (ref 0.0–0.1)
Basophils Relative: 0 % (ref 0–1)
Basophils Relative: 0 % (ref 0–1)
Eosinophils Absolute: 0 10*3/uL (ref 0.0–0.7)
Eosinophils Absolute: 0 K/uL (ref 0.0–0.7)
Eosinophils Relative: 0 % (ref 0–5)
Eosinophils Relative: 0 % (ref 0–5)
HCT: 48.5 % (ref 39.0–52.0)
HCT: 47.2 % (ref 39.0–52.0)
Hemoglobin: 16.9 g/dL (ref 13.0–17.0)
Hemoglobin: 16.6 g/dL (ref 13.0–17.0)
Lymphocytes Relative: 9 % — ABNORMAL LOW (ref 12–46)
Lymphocytes Relative: 8 % — ABNORMAL LOW (ref 12–46)
Lymphs Abs: 2.2 10*3/uL (ref 0.7–4.0)
Lymphs Abs: 1.7 K/uL (ref 0.7–4.0)
MCH: 30.7 pg (ref 26.0–34.0)
MCH: 31.4 pg (ref 26.0–34.0)
MCHC: 35.2 g/dL (ref 30.0–36.0)
MCV: 88 fL (ref 78.0–100.0)
MCV: 89.2 fL (ref 78.0–100.0)
Monocytes Absolute: 2.4 10*3/uL — ABNORMAL HIGH (ref 0.1–1.0)
Monocytes Absolute: 2 K/uL — ABNORMAL HIGH (ref 0.1–1.0)
Monocytes Relative: 10 % (ref 3–12)
Monocytes Relative: 10 % (ref 3–12)
Neutro Abs: 16 K/uL — ABNORMAL HIGH (ref 1.7–7.7)
Neutrophils Relative %: 81 % — ABNORMAL HIGH (ref 43–77)
Neutrophils Relative %: 81 % — ABNORMAL HIGH (ref 43–77)
Platelets: 202 K/uL (ref 150–400)
RBC: 5.51 MIL/uL (ref 4.22–5.81)
RBC: 5.29 MIL/uL (ref 4.22–5.81)
RDW: 15.8 % — ABNORMAL HIGH (ref 11.5–15.5)
WBC: 23.8 10*3/uL — ABNORMAL HIGH (ref 4.0–10.5)
WBC: 19.8 K/uL — ABNORMAL HIGH (ref 4.0–10.5)

## 2013-04-18 LAB — COMPREHENSIVE METABOLIC PANEL WITH GFR
ALT: 12 U/L (ref 0–53)
AST: 28 U/L (ref 0–37)
Albumin: 2.9 g/dL — ABNORMAL LOW (ref 3.5–5.2)
Alkaline Phosphatase: 83 U/L (ref 39–117)
BUN: 58 mg/dL — ABNORMAL HIGH (ref 6–23)
CO2: 18 meq/L — ABNORMAL LOW (ref 19–32)
Calcium: 9.2 mg/dL (ref 8.4–10.5)
Chloride: 100 meq/L (ref 96–112)
Creatinine, Ser: 2.89 mg/dL — ABNORMAL HIGH (ref 0.50–1.35)
GFR calc Af Amer: 22 mL/min — ABNORMAL LOW (ref >90)
GFR calc non Af Amer: 19 mL/min — ABNORMAL LOW (ref >90)
Glucose, Bld: 219 mg/dL — ABNORMAL HIGH (ref 70–99)
Potassium: 4.5 meq/L (ref 3.5–5.1)
Sodium: 136 meq/L (ref 135–145)
Total Bilirubin: 0.5 mg/dL (ref 0.3–1.2)
Total Protein: 7.6 g/dL (ref 6.0–8.3)

## 2013-04-18 LAB — APTT: aPTT: 34 s (ref 24–37)

## 2013-04-18 LAB — SAMPLE TO BLOOD BANK

## 2013-04-18 LAB — GLUCOSE, CAPILLARY: Glucose-Capillary: 113 mg/dL — ABNORMAL HIGH (ref 70–99)

## 2013-04-18 LAB — URINE MICROSCOPIC-ADD ON

## 2013-04-18 LAB — URINALYSIS, ROUTINE W REFLEX MICROSCOPIC
Glucose, UA: NEGATIVE mg/dL
Ketones, ur: 15 mg/dL — AB
Protein, ur: 30 mg/dL — AB
Specific Gravity, Urine: 1.024 (ref 1.005–1.030)
pH: 5 (ref 5.0–8.0)

## 2013-04-18 LAB — PROTIME-INR
INR: 3.46 — ABNORMAL HIGH (ref 0.00–1.49)
INR: 3.7 — ABNORMAL HIGH (ref 0.00–1.49)
Prothrombin Time: 33.5 seconds — ABNORMAL HIGH (ref 11.6–15.2)
Prothrombin Time: 35.3 s — ABNORMAL HIGH (ref 11.6–15.2)

## 2013-04-18 LAB — MAGNESIUM: Magnesium: 1.9 mg/dL (ref 1.5–2.5)

## 2013-04-18 LAB — CG4 I-STAT (LACTIC ACID): Lactic Acid, Venous: 2.68 mmol/L — ABNORMAL HIGH (ref 0.5–2.2)

## 2013-04-18 MED ORDER — PIPERACILLIN-TAZOBACTAM 3.375 G IVPB
3.3750 g | Freq: Once | INTRAVENOUS | Status: DC
  Filled 2013-04-18: qty 50

## 2013-04-18 MED ORDER — INSULIN ASPART 100 UNIT/ML ~~LOC~~ SOLN: 0.0000 [IU] | Freq: Every day | SUBCUTANEOUS | Status: DC

## 2013-04-18 MED ORDER — DILTIAZEM LOAD VIA INFUSION
10.0000 mg | Freq: Once | INTRAVENOUS | Status: AC
  Administered 2013-04-18: 10 mg via INTRAVENOUS
  Filled 2013-04-18: qty 10

## 2013-04-18 MED ORDER — SODIUM CHLORIDE 0.9 % IV SOLN
INTRAVENOUS | Status: AC
  Administered 2013-04-18: 1000 mL via INTRAVENOUS
  Administered 2013-04-18: 13:00:00 via INTRAVENOUS
  Administered 2013-04-19: 75 mL/h via INTRAVENOUS

## 2013-04-18 MED ORDER — ASPIRIN 81 MG PO CHEW: 324.0000 mg | CHEWABLE_TABLET | ORAL | Status: AC

## 2013-04-18 MED ORDER — METRONIDAZOLE 500 MG PO TABS
500.0000 mg | ORAL_TABLET | Freq: Three times a day (TID) | ORAL | Status: DC
  Administered 2013-04-18 – 2013-04-19 (×2): 500 mg via ORAL
  Filled 2013-04-18 (×5): qty 1

## 2013-04-18 MED ORDER — PANTOPRAZOLE SODIUM 40 MG PO TBEC
40.0000 mg | DELAYED_RELEASE_TABLET | Freq: Every day | ORAL | Status: DC
  Administered 2013-04-19 – 2013-04-23 (×6): 40 mg via ORAL
  Filled 2013-04-18 (×5): qty 1

## 2013-04-18 MED ORDER — ASPIRIN 81 MG PO CHEW
324.0000 mg | CHEWABLE_TABLET | Freq: Once | ORAL | Status: AC
  Administered 2013-04-18: 324 mg via ORAL
  Filled 2013-04-18: qty 4

## 2013-04-18 MED ORDER — IOHEXOL 300 MG/ML  SOLN
25.0000 mL | INTRAMUSCULAR | Status: AC
  Administered 2013-04-18 (×2): 25 mL via ORAL

## 2013-04-18 MED ORDER — PIPERACILLIN-TAZOBACTAM 3.375 G IVPB 30 MIN
3.3750 g | Freq: Once | INTRAVENOUS | Status: AC
  Administered 2013-04-18: 3.375 g via INTRAVENOUS

## 2013-04-18 MED ORDER — NITROGLYCERIN 0.4 MG SL SUBL: 0.4000 mg | SUBLINGUAL_TABLET | SUBLINGUAL | Status: DC | PRN

## 2013-04-18 MED ORDER — ASPIRIN 300 MG RE SUPP
300.0000 mg | RECTAL | Status: AC
  Filled 2013-04-18: qty 1

## 2013-04-18 MED ORDER — CARVEDILOL 3.125 MG PO TABS
3.1250 mg | ORAL_TABLET | Freq: Two times a day (BID) | ORAL | Status: DC
  Administered 2013-04-19: 06:00:00 3.125 mg via ORAL
  Filled 2013-04-18 (×3): qty 1

## 2013-04-18 MED ORDER — ASPIRIN EC 81 MG PO TBEC
81.0000 mg | DELAYED_RELEASE_TABLET | Freq: Every day | ORAL | Status: DC
  Administered 2013-04-19 – 2013-04-23 (×5): 81 mg via ORAL
  Filled 2013-04-18 (×5): qty 1

## 2013-04-18 MED ORDER — ONDANSETRON HCL 4 MG/2ML IJ SOLN
INTRAMUSCULAR | Status: AC
  Filled 2013-04-18: qty 2

## 2013-04-18 MED ORDER — SODIUM CHLORIDE 0.9 % IV BOLUS (SEPSIS)
500.0000 mL | Freq: Once | INTRAVENOUS | Status: AC
  Administered 2013-04-18: 500 mL via INTRAVENOUS

## 2013-04-18 MED ORDER — ONDANSETRON HCL 4 MG/2ML IJ SOLN: 4.0000 mg | Freq: Four times a day (QID) | INTRAMUSCULAR | Status: DC | PRN

## 2013-04-18 MED ORDER — WARFARIN - PHARMACIST DOSING INPATIENT: Freq: Every day | Status: DC

## 2013-04-18 MED ORDER — DILTIAZEM HCL 100 MG IV SOLR
5.0000 mg/h | INTRAVENOUS | Status: DC
  Administered 2013-04-18: 5 mg/h via INTRAVENOUS
  Administered 2013-04-18 – 2013-04-19 (×2): 15 mg/h via INTRAVENOUS
  Filled 2013-04-18 (×2): qty 100

## 2013-04-18 MED ORDER — ATORVASTATIN CALCIUM 20 MG PO TABS
20.0000 mg | ORAL_TABLET | Freq: Every day | ORAL | Status: DC
  Administered 2013-04-19 – 2013-04-22 (×4): 20 mg via ORAL
  Filled 2013-04-18 (×5): qty 1

## 2013-04-18 MED ORDER — INSULIN ASPART 100 UNIT/ML ~~LOC~~ SOLN
0.0000 [IU] | Freq: Three times a day (TID) | SUBCUTANEOUS | Status: DC
  Administered 2013-04-19: 13:00:00 3 [IU] via SUBCUTANEOUS
  Administered 2013-04-19: 18:00:00 1 [IU] via SUBCUTANEOUS
  Administered 2013-04-19 – 2013-04-20 (×3): 2 [IU] via SUBCUTANEOUS
  Administered 2013-04-21: 18:00:00 1 [IU] via SUBCUTANEOUS
  Administered 2013-04-21: 13:00:00 5 [IU] via SUBCUTANEOUS
  Administered 2013-04-22 (×2): 1 [IU] via SUBCUTANEOUS
  Administered 2013-04-22: 2 [IU] via SUBCUTANEOUS
  Administered 2013-04-23: 12:00:00 via SUBCUTANEOUS
  Administered 2013-04-23: 07:00:00 1 [IU] via SUBCUTANEOUS

## 2013-04-18 MED ORDER — ACETAMINOPHEN 325 MG PO TABS: 650.0000 mg | ORAL_TABLET | ORAL | Status: DC | PRN

## 2013-04-18 MED ORDER — PIPERACILLIN-TAZOBACTAM IN DEX 2-0.25 GM/50ML IV SOLN
2.2500 g | Freq: Four times a day (QID) | INTRAVENOUS | Status: DC
  Administered 2013-04-19 (×2): 2.25 g via INTRAVENOUS
  Filled 2013-04-18 (×5): qty 50

## 2013-04-18 MED ORDER — ONDANSETRON HCL 4 MG/2ML IJ SOLN
4.0000 mg | Freq: Once | INTRAMUSCULAR | Status: AC
  Administered 2013-04-18: 4 mg via INTRAVENOUS

## 2013-04-18 NOTE — Progress Notes (Signed)
Pt cleaned of incontienence of stool and linen changed

## 2013-04-18 NOTE — Progress Notes (Signed)
ANTICOAGULATION + ANTIBIOTIC CONSULT NOTE - Initial Consult  Pharmacy Consult for warfarin + zosyn Indication: atrial fibrillation  No Known Allergies  Patient Measurements:    Vital Signs: Temp: 98.4 F (36.9 C) (11/19 1055) BP: 113/61 mmHg (11/19 1730) Pulse Rate: 109 (11/19 1600)  Labs:  Recent Labs  04/18/13 1120  HGB 16.9  HCT 48.5  PLT 222  LABPROT 33.5*  INR 3.46*  CREATININE 3.50*    The CrCl is unknown because both a height and weight (above a minimum accepted value) are required for this calculation.   Medical History: Past Medical History  Diagnosis Date  . Hypertension   . Atrial fibrillation   . Cataract   . Peripheral vascular disease   . DVT (deep venous thrombosis)   . CHF (congestive heart failure)   . Cancer     prostate CA    Medications:  Anti-infectives   Start     Dose/Rate Route Frequency Ordered Stop   04/18/13 2230  piperacillin-tazobactam (ZOSYN) IVPB 2.25 g     2.25 g 100 mL/hr over 30 Minutes Intravenous Every 6 hours 04/18/13 1741     04/18/13 1745  piperacillin-tazobactam (ZOSYN) IVPB 3.375 g     3.375 g 100 mL/hr over 30 Minutes Intravenous  Once 04/18/13 1737     04/18/13 1615  piperacillin-tazobactam (ZOSYN) IVPB 3.375 g  Status:  Discontinued     3.375 g 12.5 mL/hr over 240 Minutes Intravenous  Once 04/18/13 1602 04/18/13 1737      Assessment: 82 yom presented to the ED with abdominal pain. To start empiric zosyn for possible cholecystitis. Pt is currently afebrile but WBC is elevated at 23.8. Known history of CKD with Scr elevated at 3.5. Ordered 1x dose of zosyn 3.375gm in the ED.   Also on chronic coumadin for afib. INR is elevated today at 3.46. H/H and plts are WNL. No bleeding noted.   Goal of Therapy:  INR 2-3 Eradication of infection   Plan:  1. No coumadin today 2. Daily INR 3. Zosyn 2.25gm IV Q6H - low threshold for increasing dose if renal fxn improves at all 4. F/u renal fxn, C&S, clinical status    Eureka Valdes, Drake Leach 04/18/2013,5:42 PM

## 2013-04-18 NOTE — ED Provider Notes (Signed)
TIME SEEN: 11:26 AM  CHIEF COMPLAINT: Generalized weakness, vomiting and diarrhea  HPI: Patient is an 79 year old male with a history of hypertension, A. fib on diltiazem and Coumadin, congestive heart failure, prostate cancer who presents emergency department with generalized weakness, vomiting and diarrhea for 3 days. He states his last episode of vomiting was yesterday and he is still having loose stool. No melena or bright blood per rectum. He reports his vomit was very dark. No fever or chills. No chest pain or shortness of breath. No palpitations. Patient is having some mild diffuse abdominal discomfort. No recent cough, dysuria or hematuria.  Pt has HR in 220s, pt in a fib with RVR.  PCP/Cardiologist - Harwani  ROS: See HPI Constitutional: no fever  Eyes: no drainage  ENT: no runny nose   Cardiovascular:  no chest pain  Resp: no SOB  GI: no vomiting GU: no dysuria Integumentary: no rash  Allergy: no hives  Musculoskeletal: no leg swelling  Neurological: no slurred speech ROS otherwise negative  PAST MEDICAL HISTORY/PAST SURGICAL HISTORY:  Past Medical History  Diagnosis Date  . Hypertension   . Atrial fibrillation   . Cataract   . Peripheral vascular disease   . DVT (deep venous thrombosis)   . CHF (congestive heart failure)   . Cancer     prostate CA    MEDICATIONS:  Prior to Admission medications   Medication Sig Start Date End Date Taking? Authorizing Provider  OVER THE COUNTER MEDICATION Take 4 capsules by mouth daily.   Yes Historical Provider, MD  diltiazem (DILACOR XR) 240 MG 24 hr capsule Take 240 mg by mouth daily.    Historical Provider, MD  omeprazole (PRILOSEC) 20 MG capsule Take 20 mg by mouth daily.    Historical Provider, MD  ramipril (ALTACE) 2.5 MG capsule Take 2.5 mg by mouth daily.    Historical Provider, MD  spironolactone (ALDACTONE) 25 MG tablet Take 25 mg by mouth daily.    Historical Provider, MD  warfarin (COUMADIN) 5 MG tablet Take 5 mg by  mouth daily.    Historical Provider, MD    ALLERGIES:  No Known Allergies  SOCIAL HISTORY:  History  Substance Use Topics  . Smoking status: Former Smoker    Quit date: 06/01/1991  . Smokeless tobacco: Never Used  . Alcohol Use: No    FAMILY HISTORY: No family history on file.  EXAM: BP 156/116  Pulse 67  Temp(Src) 98.4 F (36.9 C)  Resp 20  SpO2 99% CONSTITUTIONAL: Alert and oriented and responds appropriately to questions. Well-appearing; well-nourished; elderly, no apparent distress HEAD: Normocephalic EYES: Conjunctivae clear, PERRL ENT: normal nose; no rhinorrhea; dry mucous membranes; pharynx without lesions noted NECK: Supple, no meningismus, no LAD  CARD: Tachycardic, irregular; S1 and S2 appreciated; no murmurs, no clicks, no rubs, no gallops RESP: Normal chest excursion without splinting or tachypnea; breath sounds clear and equal bilaterally; no wheezes, no rhonchi, no rales,  ABD/GI: Normal bowel sounds; non-distended; soft, diffusely tender to palpation without rebound or guarding, no peritoneal signs RECTAL:  No gross blood or melena, normal tone BACK:  The back appears normal and is non-tender to palpation, there is no CVA tenderness EXT: s/p right AKA; Normal ROM in all joints; non-tender to palpation; no edema; normal capillary refill; no cyanosis    SKIN: Normal color for age and race; warm NEURO: Moves all extremities equally PSYCH: The patient's mood and manner are appropriate. Grooming and personal hygiene are appropriate.  MEDICAL DECISION  MAKING: Patient here with A. fib with RVR and diffuse abdominal pain, vomiting and diarrhea. He is otherwise hemodynamically stable. Reports he has not been able to keep down his diltiazem recently. Will obtain labs, urine, chest x-ray, abdominal CT. We'll give IV fluids, diltiazem bolus and drip. Patient will need admission.  ED PROGRESS: Patient is a leukocytosis of 23.8 with left shift. His chest x-ray is clear. CT  abdomen and pelvis and urine pending. Given his leukocytosis and tachycardia, will give empiric antibiotics.  Normal BP. Patient also has negative troponin, therapeutic INR at 3.46.  1:30 PM Patient's creatinine and BUN are elevated. Suspect this is due to dehydration. We'll continue IV fluids. His heart rate has improved in the 140s from the 210s.   2:30 PM  HR has improved to 110s.  CT pending.  4:00 PM  Pt reports he is feeling much better. He has been able to tolerate by mouth. His heart rate is still in the 1 teens. CT scan shows no acute abnormality. He does have a 3.1 cm x 3 cm infrarenal abdominal aortic aneurysm.  Suspect his symptoms may be from gastroenteritis and subsequent dehydration. We'll continue IV fluids and discuss with Dr. Lorayne Marek for admission.  No further vomiting.  Pt is still having diarrhea.  Will send stool culture.  4:23 PM  Spoke with Hirwani to see in ED for admission for dehydration, ARF, a fib with RVR.  He reports pt's last Cr on 04/11/13 was 1.5.   EKG Interpretation    Date/Time:  Wednesday April 18 2013 11:58:40 EST Ventricular Rate:  197 PR Interval:    QRS Duration: 91 QT Interval:  258 QTC Calculation: 467 R Axis:   -50 Text Interpretation:  Atrial fibrillation with rapid V-rate Left anterior fascicular block Anteroseptal infarct, old Repolarization abnormality, prob rate related Confirmed by WARD  DO, KRISTEN ([id number]) on 04/18/2013 12:10:25 PM             CRITICAL CARE Performed by: Raelyn Number   Total critical care time: 30 minutes  Critical care time was exclusive of separately billable procedures and treating other patients.  Critical care was necessary to treat or prevent imminent or life-threatening deterioration.  Critical care was time spent personally by me on the following activities: development of treatment plan with patient and/or surrogate as well as nursing, discussions with consultants, evaluation of patient's  response to treatment, examination of patient, obtaining history from patient or surrogate, ordering and performing treatments and interventions, ordering and review of laboratory studies, ordering and review of radiographic studies, pulse oximetry and re-evaluation of patient's condition.   Layla Maw Ward, DO 04/18/13 1625

## 2013-04-18 NOTE — H&P (Signed)
Cristian Miller is an 79 y.o. male.   Chief Complaint: Abdominal pain associated with nausea vomiting and diarrhea HPI: Patient is 79 year old male with past medical history significant for nonischemic dilated cardiomyopathy EF approximately 30-35%, history of congestive heart failure secondary to systolic dysfunction, chronic atrial fibrillation on chronic anticoagulants, peripheral vascular disease status post right above-knee amputation in the past, chronic kidney disease stage III, history of tobacco abuse, history of left iliac thrombectomy in the past, came to the ER complaining of vague abdominal pain associated with nausea vomiting and diarrhea for last 3 days states he is not able to keep down anything. Denies any fever or chills. Denies any melena. Denies any chest pain or shortness of breath. Patient was noted to be in A. fib with RVR heart rate and 200s received IV Cardizem bolus and was started on drip with control of heart rate in 120s. Patient was also noted to be dehydrated and had significant elevation of his creatinine from 1.5-3.5 and also was noted to have markedly elevated white count of 23,000. Patient denies any urinary complaints. Denies any cough fever or chills.  Past Medical History  Diagnosis Date  . Hypertension   . Atrial fibrillation   . Cataract   . Peripheral vascular disease   . DVT (deep venous thrombosis)   . CHF (congestive heart failure)   . Cancer     prostate CA    Past Surgical History  Procedure Laterality Date  . Leg amputation    . Cataract extraction    . Pr vein bypass graft,aorto-fem-pop      2009  right axillary bifemoral bypass and bilateral iliofemoral thrombectomies    No family history on file. Social History:  reports that he quit smoking about 21 years ago. He has never used smokeless tobacco. He reports that he does not drink alcohol or use illicit drugs.  Allergies: No Known Allergies   (Not in a hospital admission)  Results for  orders placed during the hospital encounter of 04/18/13 (from the past 48 hour(s))  CBC WITH DIFFERENTIAL     Status: Abnormal   Collection Time    04/18/13 11:20 AM      Result Value Range   WBC 23.8 (*) 4.0 - 10.5 K/uL   RBC 5.51  4.22 - 5.81 MIL/uL   Hemoglobin 16.9  13.0 - 17.0 g/dL   HCT 16.1  09.6 - 04.5 %   MCV 88.0  78.0 - 100.0 fL   MCH 30.7  26.0 - 34.0 pg   MCHC 34.8  30.0 - 36.0 g/dL   RDW 40.9 (*) 81.1 - 91.4 %   Platelets 222  150 - 400 K/uL   Neutrophils Relative % 81 (*) 43 - 77 %   Neutro Abs 19.2 (*) 1.7 - 7.7 K/uL   Lymphocytes Relative 9 (*) 12 - 46 %   Lymphs Abs 2.2  0.7 - 4.0 K/uL   Monocytes Relative 10  3 - 12 %   Monocytes Absolute 2.4 (*) 0.1 - 1.0 K/uL   Eosinophils Relative 0  0 - 5 %   Eosinophils Absolute 0.0  0.0 - 0.7 K/uL   Basophils Relative 0  0 - 1 %   Basophils Absolute 0.0  0.0 - 0.1 K/uL  COMPREHENSIVE METABOLIC PANEL     Status: Abnormal   Collection Time    04/18/13 11:20 AM      Result Value Range   Sodium 139  135 - 145 mEq/L  Potassium 3.9  3.5 - 5.1 mEq/L   Chloride 100  96 - 112 mEq/L   CO2 21  19 - 32 mEq/L   Glucose, Bld 270 (*) 70 - 99 mg/dL   BUN 55 (*) 6 - 23 mg/dL   Creatinine, Ser 0.45 (*) 0.50 - 1.35 mg/dL   Calcium 40.9  8.4 - 81.1 mg/dL   Total Protein 8.7 (*) 6.0 - 8.3 g/dL   Albumin 3.5  3.5 - 5.2 g/dL   AST 17  0 - 37 U/L   ALT 12  0 - 53 U/L   Alkaline Phosphatase 88  39 - 117 U/L   Total Bilirubin 0.6  0.3 - 1.2 mg/dL   GFR calc non Af Amer 15 (*) >90 mL/min   GFR calc Af Amer 18 (*) >90 mL/min   Comment: (NOTE)     The eGFR has been calculated using the CKD EPI equation.     This calculation has not been validated in all clinical situations.     eGFR's persistently <90 mL/min signify possible Chronic Kidney     Disease.  PROTIME-INR     Status: Abnormal   Collection Time    04/18/13 11:20 AM      Result Value Range   Prothrombin Time 33.5 (*) 11.6 - 15.2 seconds   INR 3.46 (*) 0.00 - 1.49  SAMPLE TO  BLOOD BANK     Status: None   Collection Time    04/18/13 11:20 AM      Result Value Range   Blood Bank Specimen SAMPLE AVAILABLE FOR TESTING     Sample Expiration 04/19/2013    POCT I-STAT TROPONIN I     Status: None   Collection Time    04/18/13 11:43 AM      Result Value Range   Troponin i, poc 0.03  0.00 - 0.08 ng/mL   Comment 3            Comment: Due to the release kinetics of cTnI,     a negative result within the first hours     of the onset of symptoms does not rule out     myocardial infarction with certainty.     If myocardial infarction is still suspected,     repeat the test at appropriate intervals.   Ct Abdomen Pelvis Wo Contrast  04/18/2013   CLINICAL DATA:  Weakness, diffuse abdominal pain.  EXAM: CT ABDOMEN AND PELVIS WITHOUT CONTRAST  TECHNIQUE: Multidetector CT imaging of the abdomen and pelvis was performed following the standard protocol without intravenous contrast.  COMPARISON:  None.  FINDINGS: The lung bases are clear. The heart size is enlarged. There is coronary artery atherosclerosis.  No renal, ureteral, or bladder calculi. No obstructive uropathy. No perinephric stranding is seen. There is a 6.2 x 4.2 cm right renal pelvis mass measuring fluid attenuation most consistent with a cyst. There is a 4.5 cm fluid attenuating hypodense mass in the antral lateral right kidney most consistent with a cyst. There are multiple hypodense masses in the left kidney with the largest measuring 6.8 cm and fluid attenuation most consistent with a cyst. The bladder is unremarkable.  The liver demonstrates no focal abnormality. The gallbladder is filled with cholelithiasis and gallbladder or sludge. The spleen demonstrates no focal abnormality. The adrenal glands and pancreas are normal.  The stomach, duodenum, small intestine and large intestine are unremarkable. There is a normal caliber appendix in the right lower quadrant without periappendiceal inflammatory changes.There  is  diverticulosis without evidence of diverticulitis. There is no pneumoperitoneum, pneumatosis, or portal venous gas. There is no abdominal or pelvic free fluid. There is no lymphadenopathy.  There is an infrarenal abdominal aortic aneurysm measuring 3.1 x 3 cm. There is an axillary-bifem bypass graft present. An  There is lumbar spine spondylosis most severe at L4-5 and L5-S1.  IMPRESSION: 1.  Small infrarenal abdominal aortic aneurysm measuring 3.1 x 3 cm.  2.  No urolithiasis or obstructive uropathy.  3.  Cholelithiasis.   Electronically Signed   By: Elige Ko   On: 04/18/2013 15:56   Dg Chest Port 1 View  04/18/2013   CLINICAL DATA:  Weakness and vomiting.  EXAM: PORTABLE CHEST - 1 VIEW  COMPARISON:  10/23/2008.  FINDINGS: The heart is mildly enlarged but stable. The mediastinal and hilar contours are within normal limits and unchanged. Mild tortuosity and calcification of the thoracic aorta is noted. The lungs are clear of acute process. No pleural effusion and pneumothorax.  IMPRESSION: No acute cardiopulmonary findings.   Electronically Signed   By: Loralie Champagne M.D.   On: 04/18/2013 12:04    Review of Systems  Constitutional: Negative for fever, chills and weight loss.  Eyes: Negative for blurred vision and double vision.  Cardiovascular: Negative for chest pain, palpitations, orthopnea, claudication and leg swelling.  Gastrointestinal: Positive for nausea, vomiting, abdominal pain and diarrhea. Negative for constipation and blood in stool.  Genitourinary: Negative for dysuria, urgency and frequency.  Neurological: Negative for dizziness and headaches.    Blood pressure 124/81, pulse 109, temperature 98.4 F (36.9 C), resp. rate 20, SpO2 91.00%. Physical Exam  Constitutional: He is oriented to person, place, and time.  HENT:  Head: Normocephalic and atraumatic.  Eyes: Conjunctivae are normal. Left eye exhibits no discharge. No scleral icterus.  Neck: Normal range of motion. Neck  supple. No JVD present. No tracheal deviation present. No thyromegaly present.  Cardiovascular:  Irregularly irregular tachycardic S1-S2 soft no S3 gallop  Respiratory: Effort normal and breath sounds normal. No respiratory distress. He has no wheezes. He has no rales.  GI: Soft. Bowel sounds are normal. He exhibits no distension. There is tenderness (Mild generalized tenderness no guarding or rebound).  Musculoskeletal: He exhibits no edema and no tenderness.  Neurological: He is alert and oriented to person, place, and time.     Assessment/Plan Acute gastroenteritis Cholelithiasis rule out cholecystitis Acute on chronic renal failure secondary to dehydration Nonischemic cardiomyopathy Compensated systolic heart failure Chronic atrial fibrillation with RVR Marked leukocytosis History of tobacco abuse Peripheral vascular disease status post right AKA History of tobacco abuse Left iliac thrombectomy in the past Mild dementia Plan As per orders  Vinette Crites N 04/18/2013, 4:47 PM

## 2013-04-18 NOTE — ED Notes (Signed)
NOTIFIED DR. WARD IN PERSON OF PATIENTS LAB RESULTS OF I STAT CG4 LACTIC ACID OF 2.75mmoI/L @ 13:45PM , 04/18/2013.

## 2013-04-18 NOTE — ED Notes (Signed)
Arline Asp, RN called for patient to triage.  Patient was in the restroom.  Family advised would let us know when he was out.

## 2013-04-18 NOTE — ED Notes (Signed)
Weakness and vomitihg x 3 days

## 2013-04-19 LAB — URINE CULTURE
Colony Count: NO GROWTH
Culture: NO GROWTH

## 2013-04-19 LAB — PROTIME-INR
INR: 4.01 — ABNORMAL HIGH (ref 0.00–1.49)
Prothrombin Time: 37.5 seconds — ABNORMAL HIGH (ref 11.6–15.2)

## 2013-04-19 LAB — CBC
Hemoglobin: 15.1 g/dL (ref 13.0–17.0)
Platelets: 213 10*3/uL (ref 150–400)
RBC: 4.81 MIL/uL (ref 4.22–5.81)
WBC: 19.8 10*3/uL — ABNORMAL HIGH (ref 4.0–10.5)

## 2013-04-19 LAB — BASIC METABOLIC PANEL
CO2: 21 mEq/L (ref 19–32)
Calcium: 9 mg/dL (ref 8.4–10.5)
Chloride: 102 mEq/L (ref 96–112)
GFR calc Af Amer: 24 mL/min — ABNORMAL LOW (ref >90)
Glucose, Bld: 145 mg/dL — ABNORMAL HIGH (ref 70–99)
Potassium: 3.5 mEq/L (ref 3.5–5.1)
Sodium: 138 mEq/L (ref 135–145)

## 2013-04-19 LAB — GLUCOSE, CAPILLARY
Glucose-Capillary: 234 mg/dL — ABNORMAL HIGH (ref 70–99)
Glucose-Capillary: 129 mg/dL — ABNORMAL HIGH (ref 70–99)
Glucose-Capillary: 163 mg/dL — ABNORMAL HIGH (ref 70–99)

## 2013-04-19 LAB — TSH: TSH: 1.385 u[IU]/mL (ref 0.350–4.500)

## 2013-04-19 LAB — CLOSTRIDIUM DIFFICILE BY PCR: Toxigenic C. Difficile by PCR: NEGATIVE

## 2013-04-19 LAB — LIPID PANEL
Cholesterol: 190 mg/dL (ref 0–200)
LDL Cholesterol: 134 mg/dL — ABNORMAL HIGH (ref 0–99)
Triglycerides: 245 mg/dL — ABNORMAL HIGH (ref <150)

## 2013-04-19 LAB — HEMOGLOBIN A1C: Hgb A1c MFr Bld: 7.1 % — ABNORMAL HIGH (ref <5.7)

## 2013-04-19 MED ORDER — CARVEDILOL 6.25 MG PO TABS
6.2500 mg | ORAL_TABLET | Freq: Two times a day (BID) | ORAL | Status: DC
  Administered 2013-04-19 – 2013-04-23 (×9): 6.25 mg via ORAL
  Filled 2013-04-19 (×12): qty 1

## 2013-04-19 MED ORDER — CARVEDILOL 3.125 MG PO TABS
3.1250 mg | ORAL_TABLET | Freq: Once | ORAL | Status: AC
  Administered 2013-04-19: 3.125 mg via ORAL
  Filled 2013-04-19: qty 1

## 2013-04-19 MED ORDER — PIPERACILLIN-TAZOBACTAM 3.375 G IVPB
3.3750 g | Freq: Three times a day (TID) | INTRAVENOUS | Status: DC
  Administered 2013-04-19 – 2013-04-23 (×12): 3.375 g via INTRAVENOUS
  Filled 2013-04-19 (×15): qty 50

## 2013-04-19 MED ORDER — DIGOXIN 0.25 MG/ML IJ SOLN
0.2500 mg | Freq: Once | INTRAMUSCULAR | Status: AC
  Administered 2013-04-19: 0.25 mg via INTRAVENOUS
  Filled 2013-04-19: qty 1

## 2013-04-19 NOTE — Progress Notes (Signed)
Subjective:  Patient denies any chest pain or shortness of breath. Denies any further palpitations. Denies any nausea or vomiting or diarrhea overall feels better. Denies any urinary complaints. Renal functions improving slowly  Objective:  Vital Signs in the last 24 hours: Temp:  [97.1 F (36.2 C)-98.6 F (37 C)] 98.6 F (37 C) (11/20 0553) Pulse Rate:  [60-110] 88 (11/20 0611) Resp:  [17-33] 20 (11/20 0810) BP: (85-129)/(45-93) 110/69 mmHg (11/20 0553) SpO2:  [91 %-98 %] 94 % (11/20 0553) Weight:  [74.5 kg (164 lb 3.9 oz)-75 kg (165 lb 5.5 oz)] 75 kg (165 lb 5.5 oz) (11/20 0553)  Intake/Output from previous day: 11/19 0701 - 11/20 0700 In: 1591.8 [P.O.:480; I.V.:1061.8; IV Piggyback:50] Out: -  Intake/Output from this shift: Total I/O In: 760 [P.O.:760] Out: -   Physical Exam: Neck: no adenopathy, no carotid bruit, no JVD and supple, symmetrical, trachea midline Lungs: clear to auscultation bilaterally Heart: irregularly irregular rhythm, S1, S2 normal and Soft systolic murmur noted Abdomen: soft, non-tender; bowel sounds normal; no masses,  no organomegaly Extremities: No clubbing cyanosis edema right AKA  Lab Results:  Recent Labs  04/18/13 2214 04/19/13 0540  WBC 19.8* 19.8*  HGB 16.6 15.1  PLT 202 213    Recent Labs  04/18/13 2214 04/19/13 0540  NA 136 138  K 4.5 3.5  CL 100 102  CO2 18* 21  GLUCOSE 219* 145*  BUN 58* 57*  CREATININE 2.89* 2.70*   No results found for this basename: TROPONINI, CK, MB,  in the last 72 hours Hepatic Function Panel  Recent Labs  04/18/13 2214  PROT 7.6  ALBUMIN 2.9*  AST 28  ALT 12  ALKPHOS 83  BILITOT 0.5    Recent Labs  04/19/13 0540  CHOL 190   No results found for this basename: PROTIME,  in the last 72 hours  Imaging: Imaging results have been reviewed and Ct Abdomen Pelvis Wo Contrast  04/18/2013   CLINICAL DATA:  Weakness, diffuse abdominal pain.  EXAM: CT ABDOMEN AND PELVIS WITHOUT CONTRAST   TECHNIQUE: Multidetector CT imaging of the abdomen and pelvis was performed following the standard protocol without intravenous contrast.  COMPARISON:  None.  FINDINGS: The lung bases are clear. The heart size is enlarged. There is coronary artery atherosclerosis.  No renal, ureteral, or bladder calculi. No obstructive uropathy. No perinephric stranding is seen. There is a 6.2 x 4.2 cm right renal pelvis mass measuring fluid attenuation most consistent with a cyst. There is a 4.5 cm fluid attenuating hypodense mass in the antral lateral right kidney most consistent with a cyst. There are multiple hypodense masses in the left kidney with the largest measuring 6.8 cm and fluid attenuation most consistent with a cyst. The bladder is unremarkable.  The liver demonstrates no focal abnormality. The gallbladder is filled with cholelithiasis and gallbladder or sludge. The spleen demonstrates no focal abnormality. The adrenal glands and pancreas are normal.  The stomach, duodenum, small intestine and large intestine are unremarkable. There is a normal caliber appendix in the right lower quadrant without periappendiceal inflammatory changes.There is diverticulosis without evidence of diverticulitis. There is no pneumoperitoneum, pneumatosis, or portal venous gas. There is no abdominal or pelvic free fluid. There is no lymphadenopathy.  There is an infrarenal abdominal aortic aneurysm measuring 3.1 x 3 cm. There is an axillary-bifem bypass graft present. An  There is lumbar spine spondylosis most severe at L4-5 and L5-S1.  IMPRESSION: 1.  Small infrarenal abdominal aortic aneurysm measuring  3.1 x 3 cm.  2.  No urolithiasis or obstructive uropathy.  3.  Cholelithiasis.   Electronically Signed   By: Elige Ko   On: 04/18/2013 15:56   Dg Chest Port 1 View  04/18/2013   CLINICAL DATA:  Weakness and vomiting.  EXAM: PORTABLE CHEST - 1 VIEW  COMPARISON:  10/23/2008.  FINDINGS: The heart is mildly enlarged but stable. The  mediastinal and hilar contours are within normal limits and unchanged. Mild tortuosity and calcification of the thoracic aorta is noted. The lungs are clear of acute process. No pleural effusion and pneumothorax.  IMPRESSION: No acute cardiopulmonary findings.   Electronically Signed   By: Loralie Champagne M.D.   On: 04/18/2013 12:04    Cardiac Studies:  Assessment/Plan:  Status post Acute gastroenteritis  Cholelithiasis rule out cholecystitis  Acute on chronic renal failure secondary to dehydration  Probable UTI Nonischemic cardiomyopathy  Compensated systolic heart failure  Chronic atrial fibrillation with RVR  Marked leukocytosis  History of tobacco abuse  Peripheral vascular disease status post right AKA  History of tobacco abuse  Left iliac thrombectomy in the past  Mild dementia Plan DC Flagyl Continue Zosyn for now Check cultures Continue slow hydration Check labs in a.m.  LOS: 1 day    Barnett Elzey N 04/19/2013, 11:11 AM

## 2013-04-19 NOTE — Plan of Care (Signed)
Problem: Phase I Progression Outcomes Goal: Anticoagulation Therapy per MD order Outcome: Completed/Met Date Met:  04/19/13 Pt on coumadin per MD order.  Results for Cristian Miller, Cristian Miller (MRN [id number]) as of 04/19/2013 01:08   Ref. Range 04/18/2013 22:14  Prothrombin Time Latest Range: 11.6-15.2 seconds 35.3 (H)  INR Latest Range: 0.00-1.49  3.70 (H)     Goal: Heart rate or rhythm control medication Outcome: Progressing Pt currently on cardizem drip 15mg /hr Goal: Pain controlled with appropriate interventions Outcome: Completed/Met Date Met:  04/19/13 Pt complaining of no pain

## 2013-04-19 NOTE — Progress Notes (Signed)
Pt HR sustaining 100-110, occasionally down to 95, up to 120. Pt on 15mg /hr cardizem drip currently. Pt BP 108/66. Dr. Sharyn Lull notified. New order for 0.25mg  digoxin IV and 3.125mg  coreg now. Will continue to monitor. Baron Hamper, RN

## 2013-04-19 NOTE — Progress Notes (Signed)
Utilization review completed. Darell Saputo, RN, BSN. 

## 2013-04-19 NOTE — Progress Notes (Addendum)
ANTICOAGULATION + ANTIBIOTIC CONSULT NOTE - follow up Pharmacy Consult for warfarin + zosyn Indication: atrial fibrillation  No Known Allergies  Patient Measurements: Height: 5' 6.14" (168 cm) Weight: 165 lb 5.5 oz (75 kg) (bed scale) IBW/kg (Calculated) : 64.13  Vital Signs: Temp: 98.6 F (37 C) (11/20 0553) Temp src: Oral (11/20 0553) BP: 110/69 mmHg (11/20 0553) Pulse Rate: 88 (11/20 0611)  Labs:  Recent Labs  04/18/13 1120 04/18/13 2214 04/19/13 0540  HGB 16.9 16.6 15.1  HCT 48.5 47.2 42.3  PLT 222 202 213  APTT  --  34  --   LABPROT 33.5* 35.3* 37.5*  INR 3.46* 3.70* 4.01*  CREATININE 3.50* 2.89* 2.70*    Estimated Creatinine Clearance: 19.8 ml/min (by C-G formula based on Cr of 2.7).   Assessment: 82 yom presented to the ED with abdominal pain 11/19.  He was started on empiric zosyn and flagyl for possible cholecystitis.  C diff cx negative. WBC down to 19.8 from 23.8, afebrile. Known history of CKD with Scr elevated at 3.5 on admission, now creat down to 2.70. Creat cl is 23 ml/min.  11/19 CT of abdomen: cholelithiasis.   Zosyn 11/19>> flagly 11/19>>11/20  11/19 stool negative for c diff 11/19 stool cx IP 11/29 BC x2 ngtd     Also on chronic coumadin for afib. INR is elevated today at 4.01. This could be exacerbated by drug interaction with flagyl.  He has gotten 2 doses of po flagyl.  . H/H and plts are WNL. No bleeding noted.   Goal of Therapy:  INR 2-3 Eradication of infection   Plan:  1. No coumadin again today 2. Daily INR 3. Increase zosyn to 3.375 gm IV q8h, infuse each dose over 4 hours 4. DC flagyl 5. F/u renal fxn, C&S, clinical status  Herby Abraham, Pharm.D. [phone] 04/19/2013 10:12 AM

## 2013-04-19 NOTE — Progress Notes (Signed)
Pt HR sustaining in 70s-80s, lowest to 69. Dr Sharyn Lull paged. Ordered to stop cardizem drip and notify him if HR starts sustaining >80. Pt given AM coreg. Will continue to monitor. Baron Hamper, RN

## 2013-04-19 NOTE — Progress Notes (Signed)
Inpatient Diabetes Program Recommendations  AACE/ADA: New Consensus Statement on Inpatient Glycemic Control (2013)  Target Ranges:  Prepandial:   less than 140 mg/dL      Peak postprandial:   less than 180 mg/dL (1-2 hours)      Critically ill patients:  140 - 180 mg/dL   Results for JAYDIS, DUCHENE (MRN [id number]) as of 04/19/2013 11:30  Ref. Range 04/18/2013 11:20 04/18/2013 22:14 04/19/2013 05:40  Glucose Latest Range: 70-99 mg/dL 027 (H) 253 (H) 664 (H)   Results for IZICK, GASBARRO (MRN [id number]) as of 04/19/2013 11:30  Ref. Range 04/18/2013 21:07 04/19/2013 05:51  Glucose-Capillary Latest Range: 70-99 mg/dL 563 (H) 875 (H)   Inpatient Diabetes Program Recommendations HgbA1C: Please order an A1C to determine glycemic control over the past 2-3 months. Diet: Please consider adding Carb Modified to current Heart healthy diet.  Note: Patient does not have a documented history of diabetes.  However, initial lab gluocse was 270 mg/dl on 64/33 @ 29:51.  Fasting glucose this morning was 177 mg/dl and noted patient was started on Novolog sensitive correction scale yesterday.  There is not an A1C value in the chart.  Please order an A1C.  Per ADA if A1C is greater than 6.5%, criteria for diagnosis of diabetes would be met.  Also, may want to consider adding Carb Modified to current heart healthy diet.   If patient is diagnosed with diabetes, please consult Diabetes Coordinator.  Will continue to follow.   Thanks, Orlando Penner, RN, MSN, CCRN Diabetes Coordinator Inpatient Diabetes Program [phone] (Team Pager) [phone] ([facility] office) [phone] [facility] office)

## 2013-04-20 LAB — CBC
MCH: 30.7 pg (ref 26.0–34.0)
MCHC: 34.4 g/dL (ref 30.0–36.0)
MCV: 89.4 fL (ref 78.0–100.0)
Platelets: 208 10*3/uL (ref 150–400)
RBC: 4.23 MIL/uL (ref 4.22–5.81)
RDW: 15.6 % — ABNORMAL HIGH (ref 11.5–15.5)

## 2013-04-20 LAB — GLUCOSE, CAPILLARY: Glucose-Capillary: 161 mg/dL — ABNORMAL HIGH (ref 70–99)

## 2013-04-20 LAB — BASIC METABOLIC PANEL
CO2: 24 mEq/L (ref 19–32)
Calcium: 8.5 mg/dL (ref 8.4–10.5)
Creatinine, Ser: 1.98 mg/dL — ABNORMAL HIGH (ref 0.50–1.35)
GFR calc non Af Amer: 30 mL/min — ABNORMAL LOW (ref >90)
Potassium: 4 mEq/L (ref 3.5–5.1)

## 2013-04-20 LAB — PROTIME-INR: INR: 3.85 — ABNORMAL HIGH (ref 0.00–1.49)

## 2013-04-20 MED ORDER — DIGOXIN 125 MCG PO TABS
0.1250 mg | ORAL_TABLET | Freq: Every day | ORAL | Status: DC
  Administered 2013-04-20 – 2013-04-23 (×4): 0.125 mg via ORAL
  Filled 2013-04-20 (×4): qty 1

## 2013-04-20 NOTE — Progress Notes (Signed)
ANTICOAGULATION CONSULT NOTE - follow up Pharmacy Consult for warfarin Indication: atrial fibrillation  No Known Allergies  Patient Measurements: Height: 5' 6.14" (168 cm) Weight: 165 lb 12.8 oz (75.206 kg) IBW/kg (Calculated) : 64.13  Vital Signs: Temp: 98.6 F (37 C) (11/21 0420) Temp src: Oral (11/21 0420) BP: 112/60 mmHg (11/21 0604) Pulse Rate: 113 (11/21 0604)  Labs:  Recent Labs  04/18/13 2214 04/19/13 0540 04/20/13 0455  HGB 16.6 15.1 13.0  HCT 47.2 42.3 37.8*  PLT 202 213 208  APTT 34  --   --   LABPROT 35.3* 37.5* 36.4*  INR 3.70* 4.01* 3.85*  CREATININE 2.89* 2.70* 1.98*    Estimated Creatinine Clearance: 27 ml/min (by C-G formula based on Cr of 1.98).   Assessment: 82 yom presented to the ED with abdominal pain 11/19.  He is on chronic coumadin for afib. INR remains elevated today at 3.85. Flagyl was dced 11/20 . H/H and plts are WNL. No bleeding noted.   Goal of Therapy:  INR 2-3   Plan:  1. No coumadin again today 2. Daily INR  Dawn Kiper, Pharm.D.  04/20/2013 8:41 AM

## 2013-04-20 NOTE — Progress Notes (Signed)
Subjective:  Patient denies any chest pain or shortness of breath. Complains of occasional hiccoughs. Renal function is gradually improving  Objective:  Vital Signs in the last 24 hours: Temp:  [98.5 F (36.9 C)-99.3 F (37.4 C)] 98.6 F (37 C) (11/21 0420) Pulse Rate:  [89-113] 113 (11/21 0604) Resp:  [18-20] 20 (11/20 2005) BP: (99-122)/(60-75) 112/60 mmHg (11/21 0604) SpO2:  [95 %-100 %] 100 % (11/21 0420) Weight:  [75.206 kg (165 lb 12.8 oz)] 75.206 kg (165 lb 12.8 oz) (11/21 0420)  Intake/Output from previous day: 11/20 0701 - 11/21 0700 In: 2645 [P.O.:1220; I.V.:1275; IV Piggyback:150] Out: 1400 [Urine:1400] Intake/Output from this shift: Total I/O In: 360 [P.O.:360] Out: -   Physical Exam: Neck: no adenopathy, no carotid bruit, no JVD and supple, symmetrical, trachea midline Lungs: clear to auscultation bilaterally Heart: irregularly irregular rhythm, S1, S2 normal and Soft systolic murmur noted Abdomen: soft, non-tender; bowel sounds normal; no masses,  no organomegaly Extremities: extremities normal, atraumatic, no cyanosis or edema and Right AKA  Lab Results:  Recent Labs  04/19/13 0540 04/20/13 0455  WBC 19.8* 18.7*  HGB 15.1 13.0  PLT 213 208    Recent Labs  04/19/13 0540 04/20/13 0455  NA 138 139  K 3.5 4.0  CL 102 104  CO2 21 24  GLUCOSE 145* 128*  BUN 57* 38*  CREATININE 2.70* 1.98*   No results found for this basename: TROPONINI, CK, MB,  in the last 72 hours Hepatic Function Panel  Recent Labs  04/18/13 2214  PROT 7.6  ALBUMIN 2.9*  AST 28  ALT 12  ALKPHOS 83  BILITOT 0.5    Recent Labs  04/19/13 0540  CHOL 190   No results found for this basename: PROTIME,  in the last 72 hours  Imaging: Imaging results have been reviewed and Ct Abdomen Pelvis Wo Contrast  04/18/2013   CLINICAL DATA:  Weakness, diffuse abdominal pain.  EXAM: CT ABDOMEN AND PELVIS WITHOUT CONTRAST  TECHNIQUE: Multidetector CT imaging of the abdomen and  pelvis was performed following the standard protocol without intravenous contrast.  COMPARISON:  None.  FINDINGS: The lung bases are clear. The heart size is enlarged. There is coronary artery atherosclerosis.  No renal, ureteral, or bladder calculi. No obstructive uropathy. No perinephric stranding is seen. There is a 6.2 x 4.2 cm right renal pelvis mass measuring fluid attenuation most consistent with a cyst. There is a 4.5 cm fluid attenuating hypodense mass in the antral lateral right kidney most consistent with a cyst. There are multiple hypodense masses in the left kidney with the largest measuring 6.8 cm and fluid attenuation most consistent with a cyst. The bladder is unremarkable.  The liver demonstrates no focal abnormality. The gallbladder is filled with cholelithiasis and gallbladder or sludge. The spleen demonstrates no focal abnormality. The adrenal glands and pancreas are normal.  The stomach, duodenum, small intestine and large intestine are unremarkable. There is a normal caliber appendix in the right lower quadrant without periappendiceal inflammatory changes.There is diverticulosis without evidence of diverticulitis. There is no pneumoperitoneum, pneumatosis, or portal venous gas. There is no abdominal or pelvic free fluid. There is no lymphadenopathy.  There is an infrarenal abdominal aortic aneurysm measuring 3.1 x 3 cm. There is an axillary-bifem bypass graft present. An  There is lumbar spine spondylosis most severe at L4-5 and L5-S1.  IMPRESSION: 1.  Small infrarenal abdominal aortic aneurysm measuring 3.1 x 3 cm.  2.  No urolithiasis or obstructive uropathy.  3.  Cholelithiasis.   Electronically Signed   By: Elige Ko   On: 04/18/2013 15:56   Dg Chest Port 1 View  04/18/2013   CLINICAL DATA:  Weakness and vomiting.  EXAM: PORTABLE CHEST - 1 VIEW  COMPARISON:  10/23/2008.  FINDINGS: The heart is mildly enlarged but stable. The mediastinal and hilar contours are within normal limits and  unchanged. Mild tortuosity and calcification of the thoracic aorta is noted. The lungs are clear of acute process. No pleural effusion and pneumothorax.  IMPRESSION: No acute cardiopulmonary findings.   Electronically Signed   By: Loralie Champagne M.D.   On: 04/18/2013 12:04    Cardiac Studies:  Assessment/Plan:  Status post Acute gastroenteritis  Cholelithiasis rule out cholecystitis  Acute on chronic renal failure secondary to dehydration  Probable UTI  Nonischemic cardiomyopathy  Compensated systolic heart failure  Chronic atrial fibrillation with RVR  Marked leukocytosis  History of tobacco abuse  Peripheral vascular disease status post right AKA  History of tobacco abuse  Left iliac thrombectomy in the past  Mild dementia  Plan Continue present management OT PT consult Add low-dose digoxin Dr. Algie Coffer on-call for weekend  LOS: 2 days    Jilene Spohr N 04/20/2013, 10:50 AM

## 2013-04-21 ENCOUNTER — Encounter (HOSPITAL_COMMUNITY): Payer: Self-pay | Admitting: Oncology

## 2013-04-21 DIAGNOSIS — R82998 Other abnormal findings in urine: Secondary | ICD-10-CM

## 2013-04-21 DIAGNOSIS — I4891 Unspecified atrial fibrillation: Secondary | ICD-10-CM

## 2013-04-21 DIAGNOSIS — R3129 Other microscopic hematuria: Secondary | ICD-10-CM

## 2013-04-21 DIAGNOSIS — Z8546 Personal history of malignant neoplasm of prostate: Secondary | ICD-10-CM

## 2013-04-21 DIAGNOSIS — I998 Other disorder of circulatory system: Secondary | ICD-10-CM

## 2013-04-21 DIAGNOSIS — D72829 Elevated white blood cell count, unspecified: Secondary | ICD-10-CM

## 2013-04-21 HISTORY — DX: Elevated white blood cell count, unspecified: D72.829

## 2013-04-21 LAB — PROTIME-INR
INR: 2.17 — ABNORMAL HIGH (ref 0.00–1.49)
Prothrombin Time: 23.5 seconds — ABNORMAL HIGH (ref 11.6–15.2)

## 2013-04-21 LAB — GLUCOSE, CAPILLARY
Glucose-Capillary: 113 mg/dL — ABNORMAL HIGH (ref 70–99)
Glucose-Capillary: 259 mg/dL — ABNORMAL HIGH (ref 70–99)
Glucose-Capillary: 150 mg/dL — ABNORMAL HIGH (ref 70–99)

## 2013-04-21 LAB — BASIC METABOLIC PANEL
BUN: 18 mg/dL (ref 6–23)
Chloride: 107 mEq/L (ref 96–112)
GFR calc non Af Amer: 50 mL/min — ABNORMAL LOW (ref >90)
Glucose, Bld: 110 mg/dL — ABNORMAL HIGH (ref 70–99)
Potassium: 4.1 mEq/L (ref 3.5–5.1)

## 2013-04-21 LAB — URINALYSIS W MICROSCOPIC + REFLEX CULTURE
Ketones, ur: NEGATIVE mg/dL
Nitrite: NEGATIVE
Specific Gravity, Urine: 1.019 (ref 1.005–1.030)
Urobilinogen, UA: 0.2 mg/dL (ref 0.0–1.0)

## 2013-04-21 LAB — SAVE SMEAR

## 2013-04-21 LAB — CBC
HCT: 36.2 % — ABNORMAL LOW (ref 39.0–52.0)
Hemoglobin: 12.6 g/dL — ABNORMAL LOW (ref 13.0–17.0)
MCHC: 34.8 g/dL (ref 30.0–36.0)
RBC: 4.09 MIL/uL — ABNORMAL LOW (ref 4.22–5.81)

## 2013-04-21 MED ORDER — WARFARIN SODIUM 3 MG PO TABS
3.0000 mg | ORAL_TABLET | Freq: Once | ORAL | Status: AC
  Administered 2013-04-21: 18:00:00 3 mg via ORAL
  Filled 2013-04-21: qty 1

## 2013-04-21 NOTE — Progress Notes (Signed)
Coumadin and zosyn per Rx  AC: Warfarin for afib - Home coumadin dose was 5mg  daily.  INR was elevated at 3.85 and now is 2.17 . This could be exacerbated by drug interaction with flagyl. He has gotten 2 doses of po flagyl. . Hgb down a little to 12.6. No bleeding noted. Flagyl is now d/c'ed  INR goal 2-3  ID: Zosyn for ?cholecystitis; WBC 22.3 K. Afebrile 82 yom presented to the ED with abdominal pain 11/19. He was started on empiric zosyn and flagyl for possible cholecystitis. C diff cx negative.  Known history of CKD with Scr elevated at 3.5 on admission, now creat down to 1.31 (CrCl ~41). 11/19 CT of abdomen: cholelithiasis.   Zosyn 11/19>>  flagly 11/19>>11/20   11/19 stool negative for c diff  11/19 stool cx IP  11/29 BC x2 ngtd    Plan: 1. Coumadin 3 mg po x1 2. Daily INR  3. Cont zosyn to 3.375 gm IV q8h, infuse each dose over 4 hours  4. F/u renal fxn, C&S, clinical status

## 2013-04-21 NOTE — Consult Note (Signed)
Referring MD:   PCP:  Robynn Pane, MD   Reason for Referral: Unexplained leukocytosis   Chief Complaint  Patient presents with  . Weakness  . 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 He has severe peripheral vascular disease. He has had previous vascular bypass procedures. He ultimately required a right above the knee amputation in 2009. He presented to the hospital on November 19 with a three-day history of vomiting and low-grade abdominal pain. He denied diarrhea although other interviewers elicited this history. He denied fever. He had a CT scan of the abdomen and pelvis which did not show any obvious pathology. He has bilateral benign renal cysts. No evidence for intra-abdominal abscess, diverticulitis, malignancy, lymphadenopathy, or splenomegaly. He has been afebrile since admission. He was put on empiric antibiotic therapy in view of a urinalysis which showed the presence of 11-20 white cells and 21-50 red cells but his white blood count has not come down. Urine and stool cultures are negative. C. difficile test negative. A portable chest x-ray is unremarkable. White count on admission was 19,800 with 81% neutrophils, 8% lymphocytes, 10% monocytes, hemoglobin 16.6 which fell to 12.6 after hydration, MCV 89, and platelet count 202,000. On account for comparison is from 1 year ago on 03/16/2012 with total white count was 9200, hemoglobin 14.9, and platelet count 231,000. Other counts as far back as March of 2009 also showed normal white cell count.  He has not been on any steroids. He stopped smoking 24 years ago. He cannot remember ever having had a colonoscopy. He denies any change in bowel habit, dysphagia, anorexia, or weight loss. There is no family history of any blood disorder in his 7 siblings or his 8 children. He has not noticed any skin abscess seas or ulcerations.  He has history of  prostate cancer a few years ago but does not remember who treated him and what kind of treatment that he received. He does not believe he had a prostatectomy. I don't find any radiation oncology notes in the chart. I don't find any PSA values in the chart. He is followed by Dr. Brunilda Payor.  He was found to be in atrial fibrillation with rapid ventricular response at time of admission which has been treated medically.   Past Medical History  Diagnosis Date  . Hypertension   . Atrial fibrillation   . Cataract   . Peripheral vascular disease   . DVT (deep venous thrombosis)   . CHF (congestive heart failure)   . Cancer     prostate CA  :  Past Surgical History  Procedure Laterality Date  . Leg amputation    . Cataract extraction    . Pr vein bypass graft,aorto-fem-pop      2009  right axillary bifemoral bypass and bilateral iliofemoral thrombectomies  :  . aspirin EC  81 mg Oral Daily  . atorvastatin  20 mg Oral q1800  . carvedilol  6.25 mg Oral BID WC  . digoxin  0.125 mg Oral Daily  . insulin aspart  0-5 Units Subcutaneous QHS  . insulin aspart  0-9 Units Subcutaneous TID WC  . pantoprazole  40 mg Oral Daily  . piperacillin-tazobactam (ZOSYN)  IV  3.375 g Intravenous Q8H  . warfarin  3 mg Oral ONCE-1800  . Warfarin - Pharmacist Dosing Inpatient   Does not apply q1800  :  No Known Allergies:  No family history on  file.:  History   Social History  . Marital Status: Single    Spouse Name: N/A    Number of Children:  8. 2 boys 6 girls.   . Years of Education: N/A   Occupational History  .  retired Corporate treasurer in [place]    Social History Main Topics  . Smoking status: Former Smoker stopped 24 years ago     Quit date: 06/01/1991  . Smokeless tobacco: Never Used  . Alcohol Use: No stopped 24 years ago   . Drug Use: No  . Sexual Activity: Not Currently   Other Topics Concern  . Not on file   Social History Narrative  . No narrative on file   :  ROS: Eyes: no change in vision Throat: No sore throat or dysphagia Neck: Resp:  No cough or dyspnea Cardio: No chest pain or palpitations GI: See history of present illness Extremities: No left lower extremity pain Lymph nodes:  Neurologic: No headache or change in vision   Skin: . No rash or ecchymosis. Genitourinary: No urinary tract symptoms.   Vitals: Filed Vitals:   04/21/13 0549  BP: 120/74  Pulse: 102  Temp: 98.4 F (36.9 C)  Resp: 21    PHYSICAL EXAM: General appearance: Well-nourished African American man HEENT: Arcus senilis. Pharynx no erythema or exudate. Teeth in reasonable repair. Some teeth are missing. Lymph Nodes: No cervical, supraclavicular, or axillary adenopathy Resp: Clear to auscultation resonant to percussion Cardio: Irregularly irregular cardiac rhythm no murmur Vascular: Right above-the-knee amputation. Cold, pulseless, but acyanotic left foot with dry crusting skin but no open ulcers Breasts: GI: Abdomen is soft, nontender, no mass, no organomegaly GU: Circumcised penis. Testicles not examined Extremities: See vascular exam. I did not examine his buttocks area Neurologic: He is alert awake and oriented, pupils round and equal, Arcus senilis, cranial nerves grossly normal, motor strength 5 over 5 upper extremities and left lower extremity. Skin: Dry skin left lower extremity no gross ulceration  Labs:   Recent Labs  04/20/13 0455 04/21/13 0620  WBC 18.7* 22.3*  HGB 13.0 12.6*  HCT 37.8* 36.2*  PLT 208 218  White count differential: On CBC done on 04/18/2013 with total white count 19,800:81% neutrophils, 8 lymphocytes, 10 monocytes   Recent Labs  04/20/13 0455 04/21/13 0620  NA 139 139  K 4.0 4.1  CL 104 107  CO2 24 21  GLUCOSE 128* 110*  BUN 38* 18  CREATININE 1.98* 1.31  CALCIUM 8.5 8.3*    Blood smear review:  Normochromic normocytic red cells. No spherocytes, schistocytes, or polychromasia. No significant  anisopoikilocytosis. Increase in mature neutrophils with normal lobation and granulation. Population of benign reactive large granular lymphocytes. Normal platelets. No immature myeloid cells.  Images Studies/Results: See discussion above  . Impression: Nonspecific leukemoid reaction with a benign shift to the left in normal-appearing neutrophils.  Reason for the leukocytosis is not clear at this time. He had a normal white blood count one year ago on 03/16/2012 (9200 with a hemoglobin of 14.9 and a platelet count of 231,000). There are no interim counts until the current admission. He does not appear to be grossly infected. No obvious skin abscess or ulcers. He is not on steroids. Hemoglobin did come down after hydration and is currently 12.6 g. Normal MCV. His transient GI complaints have resolved and a CT scan of the abdomen and pelvis is unremarkable for any obvious pathology. Review of the peripheral blood film does not suggest a  leukemic process.  Things to consider in the differential at this time would include a reactive process due to chronic ischemia of his left lower extremity. I would expect that if this was the case  his platelet count would be elevated as well. Occult malignancy could also present with nonspecific leukocytosis. He has no signs or symptoms or x-ray findings that point in this direction although if his white count is persistently elevated with no other explanation I would consider colonoscopy. He does have a history of prostate cancer. He has microscopic hematuria as well as pyuria with a negative urine culture. It might be worthwhile to have him reevaluated by his urologist after discharge for a cystoscopy. I will go head and check a PSA while he is here.  I have no other specific recommendations at this time. He appears clinically stable and could be followed as an outpatient if he is otherwise medically clear for discharge.  Thank you for this  consultation             Recommendation:   Malon Siddall M 04/21/2013, 1:51 PM

## 2013-04-21 NOTE — Progress Notes (Signed)
Subjective:  Feeling better. Renal function improves. T max 99.9 F on IV Zosyn. WBC count 22 K.  Objective:  Vital Signs in the last 24 hours: Temp:  [98.2 F (36.8 C)-99.9 F (37.7 C)] 98.4 F (36.9 C) (11/22 0549) Pulse Rate:  [102-121] 102 (11/22 0549) Cardiac Rhythm:  [-] Atrial fibrillation (11/21 1900) Resp:  [20-21] 21 (11/22 0549) BP: (120-138)/(70-83) 120/74 mmHg (11/22 0549) SpO2:  [95 %-99 %] 95 % (11/22 0549) Weight:  [75.3 kg (166 lb 0.1 oz)] 75.3 kg (166 lb 0.1 oz) (11/22 0549)  Physical Exam: BP Readings from Last 1 Encounters:  04/21/13 120/74     Wt Readings from Last 1 Encounters:  04/21/13 75.3 kg (166 lb 0.1 oz)    Weight change: 0.094 kg (3.3 oz)  HEENT: [place]/AT, Eyes-Brown, PERL, EOMI, Conjunctiva-Pink, Sclera-Non-icteric Neck: No JVD, No bruit, Trachea midline. Lungs:  Clear, Bilateral. Cardiac:  Irregular rhythm, normal S1 and S2, no S3. II/VI systolic murmur. Abdomen:  Soft, non-tender. Extremities:  No edema present. No cyanosis. No clubbing. Right AKA.  CNS: AxOx3, Cranial nerves grossly intact, moves all 4 extremities. Right handed. Skin: Warm and dry.   Intake/Output from previous day: 11/21 0701 - 11/22 0700 In: 1180 [P.O.:1080; IV Piggyback:100] Out: 1775 [Urine:1775]    Lab Results: BMET    Component Value Date/Time   NA 139 04/21/2013 0620   K 4.1 04/21/2013 0620   CL 107 04/21/2013 0620   CO2 21 04/21/2013 0620   GLUCOSE 110* 04/21/2013 0620   BUN 18 04/21/2013 0620   CREATININE 1.31 04/21/2013 0620   CALCIUM 8.3* 04/21/2013 0620   GFRNONAA 50* 04/21/2013 0620   GFRAA 58* 04/21/2013 0620   CBC    Component Value Date/Time   WBC 22.3* 04/21/2013 0620   RBC 4.09* 04/21/2013 0620   HGB 12.6* 04/21/2013 0620   HCT 36.2* 04/21/2013 0620   PLT 218 04/21/2013 0620   MCV 88.5 04/21/2013 0620   MCH 30.8 04/21/2013 0620   MCHC 34.8 04/21/2013 0620   RDW 15.7* 04/21/2013 0620   LYMPHSABS 1.7 04/18/2013 2214   MONOABS 2.0*  04/18/2013 2214   EOSABS 0.0 04/18/2013 2214   BASOSABS 0.0 04/18/2013 2214   CARDIAC ENZYMES No results found for this basename: CKTOTAL, CKMB, CKMBINDEX, TROPONINI    Scheduled Meds: . aspirin EC  81 mg Oral Daily  . atorvastatin  20 mg Oral q1800  . carvedilol  6.25 mg Oral BID WC  . digoxin  0.125 mg Oral Daily  . insulin aspart  0-5 Units Subcutaneous QHS  . insulin aspart  0-9 Units Subcutaneous TID WC  . pantoprazole  40 mg Oral Daily  . piperacillin-tazobactam (ZOSYN)  IV  3.375 g Intravenous Q8H  . Warfarin - Pharmacist Dosing Inpatient   Does not apply q1800   Continuous Infusions:  PRN Meds:.acetaminophen, nitroGLYCERIN, ondansetron (ZOFRAN) IV  Assessment/Plan:  Status post Acute gastroenteritis  Cholelithiasis rule out cholecystitis  Acute on chronic renal failure secondary to dehydration -improved Probable UTI  Nonischemic cardiomyopathy  Compensated systolic heart failure  Chronic atrial fibrillation with RVR -stable Marked leukocytosis  History of tobacco abuse  Peripheral vascular disease status post right AKA  History of tobacco abuse  Left iliac thrombectomy in the past  Mild dementia   Hematology consult. Increase activity as tolerated.   LOS: 3 days    Orpah Cobb  MD  04/21/2013, 10:36 AM

## 2013-04-22 LAB — CBC WITH DIFFERENTIAL/PLATELET
Eosinophils Relative: 1 % (ref 0–5)
HCT: 35.9 % — ABNORMAL LOW (ref 39.0–52.0)
Lymphocytes Relative: 10 % — ABNORMAL LOW (ref 12–46)
Lymphs Abs: 2 10*3/uL (ref 0.7–4.0)
MCV: 88.4 fL (ref 78.0–100.0)
Monocytes Absolute: 2.9 10*3/uL — ABNORMAL HIGH (ref 0.1–1.0)
Monocytes Relative: 14 % — ABNORMAL HIGH (ref 3–12)
Platelets: 230 10*3/uL (ref 150–400)
RBC: 4.06 MIL/uL — ABNORMAL LOW (ref 4.22–5.81)
WBC: 20.3 10*3/uL — ABNORMAL HIGH (ref 4.0–10.5)

## 2013-04-22 LAB — OCCULT BLOOD X 1 CARD TO LAB, STOOL: Fecal Occult Bld: POSITIVE — AB

## 2013-04-22 LAB — GLUCOSE, CAPILLARY
Glucose-Capillary: 146 mg/dL — ABNORMAL HIGH (ref 70–99)
Glucose-Capillary: 165 mg/dL — ABNORMAL HIGH (ref 70–99)
Glucose-Capillary: 168 mg/dL — ABNORMAL HIGH (ref 70–99)

## 2013-04-22 LAB — STOOL CULTURE

## 2013-04-22 LAB — PSA: PSA: 3.01 ng/mL (ref <=4.00)

## 2013-04-22 MED ORDER — CHLORPROMAZINE HCL 25 MG PO TABS
25.0000 mg | ORAL_TABLET | Freq: Three times a day (TID) | ORAL | Status: DC | PRN
  Administered 2013-04-22 – 2013-04-23 (×3): 25 mg via ORAL
  Filled 2013-04-22 (×3): qty 1

## 2013-04-22 MED ORDER — WARFARIN SODIUM 3 MG PO TABS
3.0000 mg | ORAL_TABLET | Freq: Once | ORAL | Status: AC
  Administered 2013-04-22: 3 mg via ORAL
  Filled 2013-04-22: qty 1

## 2013-04-22 NOTE — Progress Notes (Signed)
Subjective:  Feeling same. T max 99 F.   Objective:  Vital Signs in the last 24 hours: Temp:  [98.2 F (36.8 C)-99 F (37.2 C)] 98.2 F (36.8 C) (11/23 0726) Pulse Rate:  [95-109] 103 (11/23 0924) Cardiac Rhythm:  [-] Atrial fibrillation (11/22 1922) Resp:  [19-21] 19 (11/23 0726) BP: (119-144)/(76-83) 119/76 mmHg (11/23 0726) SpO2:  [95 %-100 %] 100 % (11/23 0726) Weight:  [77.5 kg (170 lb 13.7 oz)] 77.5 kg (170 lb 13.7 oz) (11/23 0726)  Physical Exam: BP Readings from Last 1 Encounters:  04/22/13 119/76     Wt Readings from Last 1 Encounters:  04/22/13 77.5 kg (170 lb 13.7 oz)    Weight change:   HEENT: [place]/AT, Eyes-Brown, PERL, EOMI, Conjunctiva-Pink, Sclera-Non-icteric Neck: No JVD, No bruit, Trachea midline. Lungs:  Clear, Bilateral. Cardiac:  Regular rhythm, normal S1 and S2, no S3.  Abdomen:  Soft, non-tender. Extremities:  No edema present. No cyanosis. No clubbing. Right AKA. CNS: AxOx3, Cranial nerves grossly intact, moves all 4 extremities. Right handed. Skin: Warm and dry.   Intake/Output from previous day: 11/22 0701 - 11/23 0700 In: 990 [P.O.:940; IV Piggyback:50] Out: 1075 [Urine:1075]    Lab Results: BMET    Component Value Date/Time   NA 139 04/21/2013 0620   K 4.1 04/21/2013 0620   CL 107 04/21/2013 0620   CO2 21 04/21/2013 0620   GLUCOSE 110* 04/21/2013 0620   BUN 18 04/21/2013 0620   CREATININE 1.31 04/21/2013 0620   CALCIUM 8.3* 04/21/2013 0620   GFRNONAA 50* 04/21/2013 0620   GFRAA 58* 04/21/2013 0620   CBC    Component Value Date/Time   WBC 20.3* 04/22/2013 0500   RBC 4.06* 04/22/2013 0500   HGB 12.5* 04/22/2013 0500   HCT 35.9* 04/22/2013 0500   PLT 230 04/22/2013 0500   MCV 88.4 04/22/2013 0500   MCH 30.8 04/22/2013 0500   MCHC 34.8 04/22/2013 0500   RDW 15.8* 04/22/2013 0500   LYMPHSABS 2.0 04/22/2013 0500   MONOABS 2.9* 04/22/2013 0500   EOSABS 0.2 04/22/2013 0500   BASOSABS 0.0 04/22/2013 0500   CARDIAC ENZYMES No  results found for this basename: CKTOTAL, CKMB, CKMBINDEX, TROPONINI    Scheduled Meds: . aspirin EC  81 mg Oral Daily  . atorvastatin  20 mg Oral q1800  . carvedilol  6.25 mg Oral BID WC  . digoxin  0.125 mg Oral Daily  . insulin aspart  0-5 Units Subcutaneous QHS  . insulin aspart  0-9 Units Subcutaneous TID WC  . pantoprazole  40 mg Oral Daily  . piperacillin-tazobactam (ZOSYN)  IV  3.375 g Intravenous Q8H  . warfarin  3 mg Oral ONCE-1800  . Warfarin - Pharmacist Dosing Inpatient   Does not apply q1800   Continuous Infusions:  PRN Meds:.acetaminophen, nitroGLYCERIN, ondansetron (ZOFRAN) IV  Assessment/Plan: Status post Acute gastroenteritis  Cholelithiasis rule out cholecystitis  Acute on chronic renal failure secondary to dehydration -improved  Probable UTI  Nonischemic cardiomyopathy  Compensated systolic heart failure  Chronic atrial fibrillation with RVR -stable  Marked leukocytosis  History of tobacco abuse  Peripheral vascular disease status post right AKA  History of tobacco abuse  Left iliac thrombectomy in the past  Mild dementia   Appreciate Hematology consult. Continue medical treatment.   LOS: 4 days    Orpah Cobb  MD  04/22/2013, 11:20 AM

## 2013-04-22 NOTE — Progress Notes (Signed)
Coumadin and zosyn per Rx  AC: Warfarin for afib - Home coumadin dose was 5mg  daily. INR was elevated at 3.85 and now is 1.92 (cont to go down prob because he hasn't gotten any doses for few days). The high INR could be exacerbated by drug interaction with flagyl. He has gotten 2 doses of po flagyl. . Hgb stable at 12.5. No bleeding noted. Dr Sharyn Lull dced flagyl   Goal INR 2-3  ID: Zosyn for ?cholecystitis; WBC 20.3 K. Afebrile 82 yom presented to the ED with abdominal pain 11/19. He was started on empiric zosyn and flagyl for possible cholecystitis. C diff cx negative.  Known history of CKD with Scr elevated at 3.5 on admission, now creat down to 1.31 (CrCl ~41). 11/19 CT of abdomen: cholelithiasis.   Zosyn 11/19>>  flagly 11/19>>11/20   11/19 stool negative for c diff  11/19 stool cx IP  11/29 BC x2 ngtd    Plan: 1. Coumadin 3 mg po x1 2. Daily INR  3. Cont zosyn to 3.375 gm IV q8h, infuse each dose over 4 hours  4. F/u renal fxn, C&S, clinical status  5. Educated

## 2013-04-23 DIAGNOSIS — R197 Diarrhea, unspecified: Secondary | ICD-10-CM

## 2013-04-23 LAB — GLUCOSE, CAPILLARY: Glucose-Capillary: 172 mg/dL — ABNORMAL HIGH (ref 70–99)

## 2013-04-23 MED ORDER — LEVOFLOXACIN 500 MG PO TABS: 500.0000 mg | ORAL_TABLET | Freq: Every day | ORAL | Status: DC

## 2013-04-23 MED ORDER — ATORVASTATIN CALCIUM 20 MG PO TABS: 20.0000 mg | ORAL_TABLET | Freq: Every day | ORAL | Status: AC

## 2013-04-23 MED ORDER — NITROGLYCERIN 0.4 MG SL SUBL: 0.4000 mg | SUBLINGUAL_TABLET | SUBLINGUAL | Status: AC | PRN

## 2013-04-23 MED ORDER — CARVEDILOL 6.25 MG PO TABS: 6.2500 mg | ORAL_TABLET | Freq: Two times a day (BID) | ORAL | Status: AC

## 2013-04-23 MED ORDER — WARFARIN SODIUM 5 MG PO TABS
5.0000 mg | ORAL_TABLET | Freq: Every day | ORAL | Status: DC
  Filled 2013-04-23: qty 1

## 2013-04-23 MED ORDER — AMIODARONE HCL 200 MG PO TABS: 200.0000 mg | ORAL_TABLET | Freq: Every day | ORAL | Status: AC

## 2013-04-23 NOTE — Care Management Note (Signed)
    Page 1 of 1   04/23/2013     1:59:55 PM   CARE MANAGEMENT NOTE 04/23/2013  Patient:  Cristian Miller, Cristian Miller   Account Number:  [id number]  Date Initiated:  04/23/2013  Documentation initiated by:  Oletta Cohn  Subjective/Objective Assessment:   79 y.o. male.  Chief Complaint: Abdominal pain associated with nausea vomiting and diarrhea//HOME WITH GIRLFRIEND AND CAREGIVER     Action/Plan:   Gentle hydration/ home with [facility]   Anticipated DC Date:  04/23/2013   Anticipated DC Plan:  HOME W HOME HEALTH SERVICES      DC Planning Services  CM consult      [facility] Choice  HOME HEALTH   Choice offered to / List presented to:  [name]        HH arranged  HH-1 RN  HH-10 DISEASE MANAGEMENT  HH-4 NURSE'S AIDE      Status of service:   Medicare Important Message given?   (If response is "NO", the following Medicare IM given date fields will be blank) Date Medicare IM given:   Date Additional Medicare IM given:    Discharge Disposition:    Per UR Regulation:    If discussed at Long Length of Stay Meetings, dates discussed:    Comments:  Spoke with pt at bedside regarding discharge planning for Home Health Services. Offered pt list of home health agencies to choose from.  Pt chose [facility] to render services of RN/NA. Ayesha Rumpf, RN of [facility] notified.  No DME needs identified at this time.

## 2013-04-23 NOTE — Discharge Summary (Signed)
Cristian Miller, Cristian Miller NO.:  [id number]  MEDICAL RECORD NO.:  [id number]  LOCATION:  4E18C                        FACILITY:  [facility]  PHYSICIAN:  Eduardo Osier. Sharyn Lull, M.D. DATE OF BIRTH:  06-14-1930  DATE OF ADMISSION:  04/18/2013 DATE OF DISCHARGE:                              DISCHARGE SUMMARY   ADMITTING DIAGNOSES: 1. Acute gastroenteritis. 2. Cholelithiasis, rule out cholecystitis. 3. Atrial fibrillation with rapid ventricular response. 4. Acute on chronic renal failure secondary to dehydration. 5. Nonischemic cardiomyopathy. 6. Compensated systolic heart failure. 7. Marked leukocytosis, etiology unclear. 8. History of tobacco abuse. 9. Peripheral vascular disease, status post right above-knee     amputation. 10.History of tobacco abuse. 11.History of left iliac thrombectomy in the past. 12.Mild dementia.  DISCHARGE DIAGNOSES: 1. Status post acute gastroenteritis. 2. Cholelithiasis. 3. Atrial fibrillation with controlled ventricular response. 4. Status post acute on chronic renal failure. 5. Nonischemic cardiomyopathy. 6. Compensated systolic heart failure. 7. Marked leukocytosis, etiology unclear. 8. History of tobacco abuse. 9. Peripheral vascular disease, status post right above-knee     amputation. 10.History of tobacco abuse. 11.History of left iliac thrombectomy in the past. 12.Mild dementia. 13.Resolving probable urinary tract infection. 14.New onset diabetes mellitus, controlled by diet.  DISCHARGE HOME MEDICATIONS: 1. Amiodarone 200 mg 1 tablet daily. 2. Atorvastatin 20 mg 1 tablet daily. 3. Carvedilol 6.25 mg 1 tablet twice daily. 4. Levofloxacin 500 mg 1 tablet daily for 7 days. 5. Nitrostat 0.4 mg sublingual use as directed. 6. Omeprazole 20 mg 1 capsule daily. 7. Ramipril 2.5 mg 1 capsule daily. 8. Aldactone 25 mg 1 tablet daily. 9. Warfarin 5 mg 1 tablet daily. 10.The patient has been advised to stop diltiazem.  DIET:  Low-salt,  low-cholesterol.  ACTIVITY:  As tolerated.  CONDITION AT DISCHARGE:  Stable.  The patient will be followed up in my office in 1 week.  We will check CBC and protime in 1 week.  CONDITION AT DISCHARGE:  Stable.  BRIEF HISTORY AND HOSPITAL COURSE:  [name] is an 79 year old male with past medical history significant for nonischemic dilated cardiomyopathy, EF of 30-35%, history of congestive heart failure secondary to systolic dysfunction, chronic atrial fibrillation on chronic anticoagulants, peripheral vascular disease, status post right AKA in the past, chronic kidney disease, stage III; history of tobacco abuse, history of left iliac thrombectomy in the past.  He came to the ER complaining of vague abdominal pain associated with nausea, vomiting, diarrhea for last 3 days.  States he is not able to keep down anything. Patient denies any fever or chills.  Denies any melena.  Denies chest pain or shortness of breath.  The patient was noted to be in AFib with RVR, rate in 200s.  The patient received IV Cardizem bolus and was started on drip with control of heart rate in 120s.  The patient was noted to be dehydrated and had significant elevation of his creatinine from 1.5-3.5, and was also noted to have markedly elevated white count of 23,000.  The patient denies any urinary complaints.  Denies any cough, fever, or chills.  PAST MEDICAL HISTORY:  As above.  PAST SURGICAL HISTORY:  He had amputation of the right  leg, had cataract extraction in the past, had aortofemoral popliteal bypass in the past.  PHYSICAL EXAMINATION:  GENERAL:  He was alert, awake, and oriented x3. VITAL SIGNS:  Blood pressure was 124/81, pulse was 109.  He was afebrile. EYES:  Conjunctiva was pink. NECK:  Supple.  No JVD.  No bruit. LUNGS:  Clear to auscultation. CARDIOVASCULAR:  Irregularly irregular.  He was tachycardic.  S1, S2 was soft.  There was no S3 gallop. ABDOMEN:  Soft.  Bowel sounds were  present.  There was mild generalized tenderness.  No guarding or rebound. EXTREMITIES:  He had right AKA. NEUROLOGIC:  Grossly intact.  LABORATORY DATA:  His sodium was 139, potassium 3.9, BUN 55, creatinine 3.50, glucose was 270.  Hemoglobin was 16.9, hematocrit 48.5, white count of 23,000.  Urine cultures were negative.  Blood cultures have been negative.  His TSH is 1.38.  Hemoglobin A1c was 7.1.  Repeat labs, yesterday hemoglobin was 12.5, hematocrit 35.9, white count of 20.3 which is slightly lower than before.  His blood sugar has ranged from 113-158.  Today's fasting sugar is 131.  His PT/INR today is 20.0, INR is 1.76.  PSA level was 3.01 which was in normal range.  BRIEF HOSPITAL COURSE:  The patient was admitted to telemetry unit.  MI was ruled out.  The patient was admitted to telemetry unit.  The patient was started on IV Cardizem which was switched to p.o. carvedilol and digoxin which has been switched to amiodarone today.  The patient's heart rate is fairly well controlled.  Patient remained afebrile during the hospital stay.  The patient was started on IV Zosyn and Flagyl, Flagyl was discontinued as C. diff toxins were also negative. Hematology consultation was obtained for marked leukocytosis.  Workup so far for leukocytosis has been negative.  The patient is eager to go home.  His diarrhea and abdominal discomfort is completely resolved. The patient will be followed up as an outpatient in my office.  The patient had heme-positive stool.  We will refer him to GI as outpatient for further evaluation.  His hemoglobin has been stable.  The patient will be discharged home on above medications and will be followed up in my office in 1 week.  We will check his CBC and PT/INR as outpatient.     Eduardo Osier. Sharyn Lull, M.D.     MNH/MEDQ  D:  04/23/2013  T:  04/23/2013  Job:  [id number]

## 2013-04-23 NOTE — Progress Notes (Signed)
He remains afebrile and spectrum antibiotics All cultures obtained he remained sterile White count remains unchanged at 20,000 ESR and a significantly elevated at 90 mm Repeat urinalysis continues to show both diarrhea and microscopic hematuria Stool guaiac positive-hard to interpret in somebody who is having diarrhea. C. difficile negative x2.   impression: Idiopathic leukocytosis. Clearly some underlying inflammatory process but not clear that this process is infectious in nature. No gross abnormalities on CT scan of the abdomen and pelvis however persistent microscopic hematuria should be further evaluated.

## 2013-04-23 NOTE — Discharge Summary (Signed)
  Discharge summary dictated on 04/23/2013 dictation number is [phone]

## 2013-04-23 NOTE — Progress Notes (Addendum)
Wrong pt

## 2013-04-23 NOTE — Progress Notes (Signed)
Pt from home alone, pt inc at times and has right AKA, care management consulted and pt set up with home services, pt being dc to home, dc instruction given to pt, medications and follow up appointments given to pt, pt stable, pt leaving with family

## 2013-04-23 NOTE — Progress Notes (Signed)
ANTICOAGULATION CONSULT NOTE - Follow Up Consult  Pharmacy Consult for Coumadin Indication: Afib  No Known Allergies  Patient Measurements: Height: 5' 6.14" (168 cm) Weight: 166 lb 14.2 oz (75.7 kg) IBW/kg (Calculated) : 64.13 Heparin Dosing Weight:   Vital Signs: Temp: 98 F (36.7 C) (11/24 0405) Temp src: Oral (11/24 0405) BP: 113/74 mmHg (11/24 0405) Pulse Rate: 105 (11/24 0405)  Labs:  Recent Labs  04/21/13 0620 04/22/13 0500 04/23/13 0520  HGB 12.6* 12.5*  --   HCT 36.2* 35.9*  --   PLT 218 230  --   LABPROT 23.5* 21.4* 20.0*  INR 2.17* 1.92* 1.76*  CREATININE 1.31  --   --     Estimated Creatinine Clearance: 40.8 ml/min (by C-G formula based on Cr of 1.31).   Assessment: CC: abdominal pain, N/V/D 82 yom presented to the ED with abdominal pain 11/19. He was started on empiric zosyn and flagyl for possible cholecystitis. C diff cx negative.  Known history of CKD with Scr elevated at 3.5 on admission, now creat down to 1.31 (CrCl ~41). 11/19 CT of abdomen: cholelithiasis.   PMH: HTN, afib, cataract, PVD, DVT, CHF, prostate ca  AC: Warfarin for afib - Home coumadin dose was 5mg  daily with admit INR 3.46. The high INR could be exacerbated by drug interaction with flagyl. He has gotten 2 doses of po flagyl (now d/c'd). INR 1.76  ID: Zosyn for cholecystitis; WBC 20.3 K. Afebrile  Zosyn 11/19>>  flagly 11/19>>11/20   11/19 stool negative for c diff  11/19 stool cx IP  11/29 BC x2 ngtd   CV: Afib, HTN, CHF, PVD w/ leg amputation, NICM, BP ok but HR 95-111. Meds: ASA 81mg , Lipitor, Coreg, Dig  Endo/GI: DM with CBG <131-168. On SSI, Protonix  Neuro: mild dementia  Renal: CKD, creat down to 1.31 (admit 3.5)  Heme/Onc:prostate cancer. Hgb 12.5 and Plt 230 K stable.  Home meds not resumed: diltiazem, Altace, Spironolactone   Goal of Therapy:  INR 2-3 Monitor platelets by anticoagulation protocol: Yes   Plan:  Resume Coumadin 5mg  daily to increase  INR Daily INR   Cristian Miller, PharmD, [facility] Clinical Staff Pharmacist Pager [phone]  Misty Stanley Stillinger 04/23/2013,11:16 AM

## 2013-04-24 LAB — CULTURE, BLOOD (ROUTINE X 2): Culture: NO GROWTH

## 2014-05-09 ENCOUNTER — Encounter (HOSPITAL_COMMUNITY): Payer: Self-pay | Admitting: Vascular Surgery

## 2014-09-05 ENCOUNTER — Emergency Department (HOSPITAL_COMMUNITY)
Admission: EM | Admit: 2014-09-05 | Discharge: 2014-09-05 | Disposition: A | Discharge status: Home / Self Care | Payer: Medicare HMO | Attending: Emergency Medicine | Admitting: Emergency Medicine

## 2014-09-05 ENCOUNTER — Encounter (HOSPITAL_COMMUNITY): Payer: Self-pay | Admitting: Emergency Medicine

## 2014-09-05 DIAGNOSIS — Z86718 Personal history of other venous thrombosis and embolism: Secondary | ICD-10-CM | POA: Diagnosis not present

## 2014-09-05 DIAGNOSIS — R040 Epistaxis: Secondary | ICD-10-CM | POA: Diagnosis not present

## 2014-09-05 DIAGNOSIS — I509 Heart failure, unspecified: Secondary | ICD-10-CM | POA: Insufficient documentation

## 2014-09-05 DIAGNOSIS — Z792 Long term (current) use of antibiotics: Secondary | ICD-10-CM | POA: Insufficient documentation

## 2014-09-05 DIAGNOSIS — Z8546 Personal history of malignant neoplasm of prostate: Secondary | ICD-10-CM | POA: Insufficient documentation

## 2014-09-05 DIAGNOSIS — Z9849 Cataract extraction status, unspecified eye: Secondary | ICD-10-CM | POA: Diagnosis not present

## 2014-09-05 DIAGNOSIS — I4891 Unspecified atrial fibrillation: Secondary | ICD-10-CM | POA: Diagnosis not present

## 2014-09-05 DIAGNOSIS — I10 Essential (primary) hypertension: Secondary | ICD-10-CM | POA: Diagnosis not present

## 2014-09-05 DIAGNOSIS — Z862 Personal history of diseases of the blood and blood-forming organs and certain disorders involving the immune mechanism: Secondary | ICD-10-CM | POA: Diagnosis not present

## 2014-09-05 DIAGNOSIS — Z87891 Personal history of nicotine dependence: Secondary | ICD-10-CM | POA: Insufficient documentation

## 2014-09-05 DIAGNOSIS — R58 Hemorrhage, not elsewhere classified: Secondary | ICD-10-CM | POA: Insufficient documentation

## 2014-09-05 DIAGNOSIS — Z7901 Long term (current) use of anticoagulants: Secondary | ICD-10-CM | POA: Diagnosis not present

## 2014-09-05 DIAGNOSIS — R5383 Other fatigue: Secondary | ICD-10-CM | POA: Diagnosis not present

## 2014-09-05 DIAGNOSIS — Z79899 Other long term (current) drug therapy: Secondary | ICD-10-CM | POA: Diagnosis not present

## 2014-09-05 LAB — CBC
HCT: 45.1 % (ref 39.0–52.0)
HEMOGLOBIN: 15.1 g/dL (ref 13.0–17.0)
MCH: 30.4 pg (ref 26.0–34.0)
MCHC: 33.5 g/dL (ref 30.0–36.0)
MCV: 90.7 fL (ref 78.0–100.0)
Platelets: 183 10*3/uL (ref 150–400)
RBC: 4.97 MIL/uL (ref 4.22–5.81)
RDW: 16.2 % — ABNORMAL HIGH (ref 11.5–15.5)
WBC: 9.6 10*3/uL (ref 4.0–10.5)

## 2014-09-05 LAB — I-STAT CHEM 8, ED
BUN: 34 mg/dL — AB (ref 6–23)
CALCIUM ION: 1.26 mmol/L (ref 1.13–1.30)
CHLORIDE: 102 mmol/L (ref 96–112)
Creatinine, Ser: 1.7 mg/dL — ABNORMAL HIGH (ref 0.50–1.35)
Glucose, Bld: 127 mg/dL — ABNORMAL HIGH (ref 70–99)
HCT: 50 % (ref 39.0–52.0)
Hemoglobin: 17 g/dL (ref 13.0–17.0)
Potassium: 5.3 mmol/L — ABNORMAL HIGH (ref 3.5–5.1)
SODIUM: 139 mmol/L (ref 135–145)
TCO2: 23 mmol/L (ref 0–100)

## 2014-09-05 LAB — PROTIME-INR
INR: 3.17 — ABNORMAL HIGH (ref 0.00–1.49)
PROTHROMBIN TIME: 32.7 s — AB (ref 11.6–15.2)

## 2014-09-05 NOTE — Discharge Instructions (Signed)

## 2014-09-05 NOTE — ED Notes (Addendum)
Pt reports having a nosebleed bright red in color since 10 am- denies injury to nose- nose bleeding at present.  Pt is on Coumadin.

## 2014-09-05 NOTE — ED Provider Notes (Signed)
CSN: [id number]     Arrival date & time 09/05/14  1632 History   First MD Initiated Contact with Patient 09/05/14 1653     Chief Complaint  Patient presents with  . Epistaxis     (Consider location/radiation/quality/duration/timing/severity/associated sxs/prior Treatment) Patient is a 81 y.o. male presenting with nosebleeds. The history is provided by the patient.  Epistaxis Location:  L nare Associated symptoms: no cough    patient's had bleeding out of his nose since 10:30 this morning. States is an mostly on the left side was coming out of the right side and down his throat to. No other bleeding. He is on Coumadin. Patient states he feels bad. No lightheadedness or dizziness. No fevers. No trauma.  Past Medical History  Diagnosis Date  . Hypertension   . Atrial fibrillation   . Cataract   . Peripheral vascular disease   . DVT (deep venous thrombosis)   . CHF (congestive heart failure)   . Cancer     prostate CA  . Leukocytosis, unspecified 04/21/2013   Past Surgical History  Procedure Laterality Date  . Leg amputation    . Cataract extraction    . Pr vein bypass graft,aorto-fem-pop      2009  right axillary bifemoral bypass and bilateral iliofemoral thrombectomies  . Abdominal aortagram N/A 03/15/2012    Procedure: ABDOMINAL Maxcine Ham;  Surgeon: Elam Dutch, MD;  Location: [facility] CATH LAB;  Service: Cardiovascular;  Laterality: N/A;   No family history on file. History  Substance Use Topics  . Smoking status: Former Smoker    Quit date: 06/01/1991  . Smokeless tobacco: Never Used  . Alcohol Use: No    Review of Systems  Constitutional: Positive for fatigue. Negative for appetite change.  HENT: Positive for nosebleeds.   Respiratory: Negative for cough and shortness of breath.   Gastrointestinal: Negative for abdominal pain and anal bleeding.  Musculoskeletal: Negative for back pain.  Skin: Negative for wound.  Hematological: Negative for adenopathy.  Bruises/bleeds easily.      Allergies  Review of patient's allergies indicates no known allergies.  Home Medications   Prior to Admission medications   Medication Sig Start Date End Date Taking? Authorizing Provider  amiodarone (PACERONE) 200 MG tablet Take 1 tablet (200 mg total) by mouth daily. 04/23/13  Yes Charolette Forward, MD  atorvastatin (LIPITOR) 20 MG tablet Take 1 tablet (20 mg total) by mouth daily at 6 PM. 04/23/13  Yes Charolette Forward, MD  carvedilol (COREG) 6.25 MG tablet Take 1 tablet (6.25 mg total) by mouth 2 (two) times daily with a meal. 04/23/13  Yes Charolette Forward, MD  ramipril (ALTACE) 2.5 MG capsule Take 2.5 mg by mouth daily.   Yes Historical Provider, MD  warfarin (COUMADIN) 5 MG tablet Take 5 mg by mouth daily at 6 PM.    Yes Historical Provider, MD  levofloxacin (LEVAQUIN) 500 MG tablet Take 1 tablet (500 mg total) by mouth daily. 04/23/13   Charolette Forward, MD  nitroGLYCERIN (NITROSTAT) 0.4 MG SL tablet Place 1 tablet (0.4 mg total) under the tongue every 5 (five) minutes x 3 doses as needed for chest pain. 04/23/13   Charolette Forward, MD   BP 108/65 mmHg  Pulse 89  Temp(Src) 98.2 F (36.8 C) (Oral)  Resp 16  Ht 5' 11.5" (1.816 m)  Wt 180 lb (81.647 kg)  BMI 24.76 kg/m2  SpO2 100% Physical Exam  Constitutional: He appears well-developed and well-nourished.  HENT:  Some active bleeding out of  left nerve. Some blood in posterior pharynx.  Eyes: Pupils are equal, round, and reactive to light.  Neck: Neck supple.  Cardiovascular: Normal rate.   Pulmonary/Chest: Effort normal.  Abdominal: Soft.  Musculoskeletal: Normal range of motion.  Right lower leg amputation    ED Course  EPISTAXIS MANAGEMENT Date/Time: 09/05/2014 6:00 PM Performed by: Davonna Belling Authorized by: Davonna Belling Consent: Verbal consent obtained. Written consent not obtained. Risks and benefits: risks, benefits and alternatives were discussed Consent given by: patient Patient  understanding: patient states understanding of the procedure being performed Required items: required blood products, implants, devices, and special equipment available Patient identity confirmed: verbally with patient Time out: Immediately prior to procedure a "time out" was called to verify the correct patient, procedure, equipment, support staff and site/side marked as required. Patient sedated: no Treatment site: left anterior Repair method: nasal balloon Post-procedure assessment: bleeding stopped Treatment complexity: simple Patient tolerance: Patient tolerated the procedure well with no immediate complications   (including critical care time) Labs Review Labs Reviewed  PROTIME-INR - Abnormal; Notable for the following:    Prothrombin Time 32.7 (*)    INR 3.17 (*)    All other components within normal limits  CBC - Abnormal; Notable for the following:    RDW 16.2 (*)    All other components within normal limits  I-STAT CHEM 8, ED - Abnormal; Notable for the following:    Potassium 5.3 (*)    BUN 34 (*)    Creatinine, Ser 1.70 (*)    Glucose, Bld 127 (*)    All other components within normal limits    Imaging Review No results found.   EKG Interpretation None      MDM   Final diagnoses:  Anterior epistaxis    Patient with epistaxis on Coumadin. INR is therapeutic. Bleeding controlled with balloon. Will discharge home.    Davonna Belling, MD 09/05/14 2025

## 2014-09-05 NOTE — ED Notes (Signed)
Please call Bryson Ha (caretaker)when patient ready for dc @ [phone]

## 2014-09-06 ENCOUNTER — Encounter (HOSPITAL_COMMUNITY): Payer: Self-pay | Admitting: Family Medicine

## 2014-09-06 ENCOUNTER — Emergency Department (HOSPITAL_COMMUNITY)
Admission: EM | Admit: 2014-09-06 | Discharge: 2014-09-06 | Disposition: A | Discharge status: Home / Self Care | Payer: Medicare HMO | Attending: Emergency Medicine | Admitting: Emergency Medicine

## 2014-09-06 DIAGNOSIS — R040 Epistaxis: Secondary | ICD-10-CM | POA: Insufficient documentation

## 2014-09-06 DIAGNOSIS — Z86718 Personal history of other venous thrombosis and embolism: Secondary | ICD-10-CM | POA: Insufficient documentation

## 2014-09-06 DIAGNOSIS — Z862 Personal history of diseases of the blood and blood-forming organs and certain disorders involving the immune mechanism: Secondary | ICD-10-CM | POA: Diagnosis not present

## 2014-09-06 DIAGNOSIS — Z79899 Other long term (current) drug therapy: Secondary | ICD-10-CM | POA: Diagnosis not present

## 2014-09-06 DIAGNOSIS — I509 Heart failure, unspecified: Secondary | ICD-10-CM | POA: Insufficient documentation

## 2014-09-06 DIAGNOSIS — Z09 Encounter for follow-up examination after completed treatment for conditions other than malignant neoplasm: Secondary | ICD-10-CM | POA: Diagnosis present

## 2014-09-06 DIAGNOSIS — Z87891 Personal history of nicotine dependence: Secondary | ICD-10-CM | POA: Diagnosis not present

## 2014-09-06 DIAGNOSIS — Z8669 Personal history of other diseases of the nervous system and sense organs: Secondary | ICD-10-CM | POA: Insufficient documentation

## 2014-09-06 DIAGNOSIS — I10 Essential (primary) hypertension: Secondary | ICD-10-CM | POA: Insufficient documentation

## 2014-09-06 DIAGNOSIS — Z7901 Long term (current) use of anticoagulants: Secondary | ICD-10-CM | POA: Insufficient documentation

## 2014-09-06 DIAGNOSIS — Z8546 Personal history of malignant neoplasm of prostate: Secondary | ICD-10-CM | POA: Diagnosis not present

## 2014-09-06 DIAGNOSIS — Z792 Long term (current) use of antibiotics: Secondary | ICD-10-CM | POA: Diagnosis not present

## 2014-09-06 MED ORDER — ACETAMINOPHEN 325 MG PO TABS
650.0000 mg | ORAL_TABLET | Freq: Once | ORAL | Status: AC
  Administered 2014-09-06: 650 mg via ORAL
  Filled 2014-09-06: qty 2

## 2014-09-06 NOTE — ED Provider Notes (Addendum)
CSN: [id number]     Arrival date & time 09/06/14  1150 History   First MD Initiated Contact with Patient 09/06/14 1453     Chief Complaint  Patient presents with  . Follow-up     (Consider location/radiation/quality/duration/timing/severity/associated sxs/prior Treatment) HPI Comments: Patient presents today for recheck. He was seen yesterday for nosebleed and had packing placed in his left nasal cavity.  Marland Kitchen He denies any new bleeding but has had a little bit of oozing out of the left nostril where the packing is. No bleeding down the back of his throat.  The history is provided by the patient.    Past Medical History  Diagnosis Date  . Hypertension   . Atrial fibrillation   . Cataract   . Peripheral vascular disease   . DVT (deep venous thrombosis)   . CHF (congestive heart failure)   . Cancer     prostate CA  . Leukocytosis, unspecified 04/21/2013   Past Surgical History  Procedure Laterality Date  . Leg amputation    . Cataract extraction    . Pr vein bypass graft,aorto-fem-pop      2009  right axillary bifemoral bypass and bilateral iliofemoral thrombectomies  . Abdominal aortagram N/A 03/15/2012    Procedure: ABDOMINAL Maxcine Ham;  Surgeon: Elam Dutch, MD;  Location: [facility] CATH LAB;  Service: Cardiovascular;  Laterality: N/A;   History reviewed. No pertinent family history. History  Substance Use Topics  . Smoking status: Former Smoker    Quit date: 06/01/1991  . Smokeless tobacco: Never Used  . Alcohol Use: No    Review of Systems  All other systems reviewed and are negative.     Allergies  Review of patient's allergies indicates no known allergies.  Home Medications   Prior to Admission medications   Medication Sig Start Date End Date Taking? Authorizing Provider  amiodarone (PACERONE) 200 MG tablet Take 1 tablet (200 mg total) by mouth daily. 04/23/13   Charolette Forward, MD  atorvastatin (LIPITOR) 20 MG tablet Take 1 tablet (20 mg total) by mouth daily  at 6 PM. 04/23/13   Charolette Forward, MD  carvedilol (COREG) 6.25 MG tablet Take 1 tablet (6.25 mg total) by mouth 2 (two) times daily with a meal. 04/23/13   Charolette Forward, MD  levofloxacin (LEVAQUIN) 500 MG tablet Take 1 tablet (500 mg total) by mouth daily. 04/23/13   Charolette Forward, MD  nitroGLYCERIN (NITROSTAT) 0.4 MG SL tablet Place 1 tablet (0.4 mg total) under the tongue every 5 (five) minutes x 3 doses as needed for chest pain. 04/23/13   Charolette Forward, MD  ramipril (ALTACE) 2.5 MG capsule Take 2.5 mg by mouth daily.    Historical Provider, MD  warfarin (COUMADIN) 5 MG tablet Take 5 mg by mouth daily at 6 PM.     Historical Provider, MD   BP 103/64 mmHg  Pulse 83  Temp(Src) 98.3 F (36.8 C) (Oral)  Resp 17  Ht 5\' 11"  (1.803 m)  Wt 185 lb (83.915 kg)  BMI 25.81 kg/m2  SpO2 100% Physical Exam  Constitutional: He is oriented to person, place, and time. He appears well-developed and well-nourished. No distress.  HENT:  Head: Normocephalic and atraumatic.  Packing in place in the left snare. Dried clotted blood around the packing with minimal clear oozing. No right-sided epistaxis and no blood in the posterior pharynx  Eyes: EOM are normal. Pupils are equal, round, and reactive to light.  Cardiovascular: Normal rate.   Pulmonary/Chest: Effort normal.  Neurological: He is alert and oriented to person, place, and time.  Skin: Skin is warm and dry.  Psychiatric: He has a normal mood and affect. His behavior is normal.  Nursing note and vitals reviewed.   ED Course  Procedures (including critical care time) Labs Review Labs Reviewed - No data to display  Imaging Review No results found.   EKG Interpretation None      MDM   Final diagnoses:  Epistaxis    Patient presenting here for recheck. Patient was seen yesterday for epistaxis and had left nasal packing placed. He is returning today because her significant pain in his nose. He denies any recurrent bleeding at this  time. Discussed with the patient that we could not remove the packing however 2 mL's of air was removed with significant improvement in his discomfort. He has no evidence of new acute bleeding. Observe for 20-30 minutes to assure no repeat bleeding for the patient is discharged home.  4:13 PM No bleeding and pain is improved.  Will d/ chome.  Blanchie Dessert, MD 09/06/14 [id number]  Blanchie Dessert, MD 09/06/14 [phone]

## 2014-09-06 NOTE — ED Notes (Signed)
Pt here for follow up of nose bleed that was packed yesterday. sts pain started last night. sts some bleeding on the right side.

## 2014-09-06 NOTE — ED Notes (Signed)
Pt waiting for his care giver to take him home.

## 2014-09-06 NOTE — Discharge Instructions (Signed)
Nosebleed °A nosebleed can be caused by many things, including: °· Getting hit hard in the nose. °· Infections. °· Dry nose. °· Colds. °· Medicines. °Your doctor may do lab testing if you get nosebleeds a lot and the cause is not known. °HOME CARE  °· If your nose was packed with material, keep it there until your doctor takes it out. Put the pack back in your nose if the pack falls out. °· Do not blow your nose for 12 hours after the nosebleed. °· Sit up and bend forward if your nose starts bleeding again. Pinch the front half of your nose nonstop for 20 minutes. °· Put petroleum jelly inside your nose every morning if you have a dry nose. °· Use a humidifier to make the air less dry. °· Do not take aspirin. °· Try not to strain, lift, or bend at the waist for many days after the nosebleed. °GET HELP RIGHT AWAY IF:  °· Nosebleeds keep happening and are hard to stop or control. °· You have bleeding or bruises that are not normal on other parts of the body. °· You have a fever. °· The nosebleeds get worse. °· You get lightheaded, feel faint, sweaty, or throw up (vomit) blood. °MAKE SURE YOU:  °· Understand these instructions. °· Will watch your condition. °· Will get help right away if you are not doing well or get worse. °Document Released: 02/24/2008 Document Revised: 08/09/2011 Document Reviewed: 02/24/2008 °Exit[employer]® Patient Information ©2015 [employer]. This information is not intended to replace advice given to you by your health care provider. Make sure you discuss any questions you have with your health care provider. ° °

## 2014-09-10 ENCOUNTER — Encounter (HOSPITAL_COMMUNITY): Payer: Self-pay

## 2014-09-10 ENCOUNTER — Emergency Department (HOSPITAL_COMMUNITY)
Admission: EM | Admit: 2014-09-10 | Discharge: 2014-09-10 | Disposition: A | Discharge status: Home / Self Care | Payer: Medicare HMO | Attending: Emergency Medicine | Admitting: Emergency Medicine

## 2014-09-10 DIAGNOSIS — Z79899 Other long term (current) drug therapy: Secondary | ICD-10-CM | POA: Insufficient documentation

## 2014-09-10 DIAGNOSIS — I10 Essential (primary) hypertension: Secondary | ICD-10-CM | POA: Diagnosis not present

## 2014-09-10 DIAGNOSIS — Z8546 Personal history of malignant neoplasm of prostate: Secondary | ICD-10-CM | POA: Insufficient documentation

## 2014-09-10 DIAGNOSIS — Z862 Personal history of diseases of the blood and blood-forming organs and certain disorders involving the immune mechanism: Secondary | ICD-10-CM | POA: Diagnosis not present

## 2014-09-10 DIAGNOSIS — R040 Epistaxis: Secondary | ICD-10-CM | POA: Insufficient documentation

## 2014-09-10 DIAGNOSIS — Z9889 Other specified postprocedural states: Secondary | ICD-10-CM | POA: Insufficient documentation

## 2014-09-10 DIAGNOSIS — Z87891 Personal history of nicotine dependence: Secondary | ICD-10-CM | POA: Diagnosis not present

## 2014-09-10 DIAGNOSIS — Z791 Long term (current) use of non-steroidal anti-inflammatories (NSAID): Secondary | ICD-10-CM | POA: Insufficient documentation

## 2014-09-10 DIAGNOSIS — Z4800 Encounter for change or removal of nonsurgical wound dressing: Secondary | ICD-10-CM | POA: Diagnosis present

## 2014-09-10 DIAGNOSIS — I509 Heart failure, unspecified: Secondary | ICD-10-CM | POA: Diagnosis not present

## 2014-09-10 DIAGNOSIS — I4891 Unspecified atrial fibrillation: Secondary | ICD-10-CM | POA: Diagnosis not present

## 2014-09-10 DIAGNOSIS — Z86718 Personal history of other venous thrombosis and embolism: Secondary | ICD-10-CM | POA: Insufficient documentation

## 2014-09-10 NOTE — ED Notes (Signed)
Pt here for nasal packing to left nare to be removed. Has not gone to see any other providers to have it checked. Was placed on the 8th. Denies any pain.

## 2014-09-10 NOTE — Discharge Instructions (Signed)
If you were given medicines take as directed.  If you are on coumadin or contraceptives realize their levels and effectiveness is altered by many different medicines.  If you have any reaction (rash, tongues swelling, other) to the medicines stop taking and see a physician.   Please follow up as directed and return to the ER or see a physician for new or worsening symptoms.  Thank you. Filed Vitals:   09/10/14 1530 09/10/14 1545 09/10/14 1600 09/10/14 1615  BP: 135/81 117/75 124/94 138/86  Pulse: 75 80 76 69  Temp:      TempSrc:      Resp:    16  Height:      Weight:      SpO2: 97% 97% 98% 95%    Nosebleed A nosebleed can be caused by many things, including:  Getting hit hard in the nose.  Infections.  Dry nose.  Colds.  Medicines. Your doctor may do lab testing if you get nosebleeds a lot and the cause is not known. HOME CARE   If your nose was packed with material, keep it there until your doctor takes it out. Put the pack back in your nose if the pack falls out.  Do not blow your nose for 12 hours after the nosebleed.  Sit up and bend forward if your nose starts bleeding again. Pinch the front half of your nose nonstop for 20 minutes.  Put petroleum jelly inside your nose every morning if you have a dry nose.  Use a humidifier to make the air less dry.  Do not take aspirin.  Try not to strain, lift, or bend at the waist for many days after the nosebleed. GET HELP RIGHT AWAY IF:   Nosebleeds keep happening and are hard to stop or control.  You have bleeding or bruises that are not normal on other parts of the body.  You have a fever.  The nosebleeds get worse.  You get lightheaded, feel faint, sweaty, or throw up (vomit) blood. MAKE SURE YOU:   Understand these instructions.  Will watch your condition.  Will get help right away if you are not doing well or get worse. Document Released: 02/24/2008 Document Revised: 08/09/2011 Document Reviewed:  02/24/2008 [facility] Patient Information 2015 [place], [place]. This information is not intended to replace advice given to you by your health care provider. Make sure you discuss any questions you have with your health care provider.

## 2014-09-10 NOTE — ED Provider Notes (Signed)
CSN: [id number]     Arrival date & time 09/10/14  1155 History   First MD Initiated Contact with Patient 09/10/14 1500     Chief Complaint  Patient presents with  . nasal packing removed      (Consider location/radiation/quality/duration/timing/severity/associated sxs/prior Treatment) HPI  This is a 81 yo male with PMH HTN, a fib, DVT, on coumadin, presenting today for check of nasal packing.  Pt suffered epistaxis 5 days ago, presented here, left-sided packing was placed.  Epistaxis resolved.  No additional meds were taken.  INR was therapeutic.  Presented 4 days ago to this ED for pain associated with packing.  2 cc air were removed from packing which alleviated pain.  Pt instructed to return 5 days after initial presentation for recheck.  At this time, pt denies bleeding or pain.  Negative for bleeding from gums or any other places.  Negative for CP, SOB, LE swelling or pain.   Past Medical History  Diagnosis Date  . Hypertension   . Atrial fibrillation   . Cataract   . Peripheral vascular disease   . DVT (deep venous thrombosis)   . CHF (congestive heart failure)   . Cancer     prostate CA  . Leukocytosis, unspecified 04/21/2013   Past Surgical History  Procedure Laterality Date  . Leg amputation    . Cataract extraction    . Pr vein bypass graft,aorto-fem-pop      2009  right axillary bifemoral bypass and bilateral iliofemoral thrombectomies  . Abdominal aortagram N/A 03/15/2012    Procedure: ABDOMINAL Maxcine Ham;  Surgeon: Elam Dutch, MD;  Location: [facility] CATH LAB;  Service: Cardiovascular;  Laterality: N/A;   No family history on file. History  Substance Use Topics  . Smoking status: Former Smoker    Quit date: 06/01/1991  . Smokeless tobacco: Never Used  . Alcohol Use: No    Review of Systems  Constitutional: Negative for fever and chills.  HENT: Negative for facial swelling.   Eyes: Negative for pain and visual disturbance.  Respiratory: Negative for chest  tightness and shortness of breath.   Cardiovascular: Negative for chest pain.  Gastrointestinal: Negative for nausea and vomiting.  Genitourinary: Negative for dysuria.  Musculoskeletal: Negative for myalgias and arthralgias.  Neurological: Negative for headaches.  Psychiatric/Behavioral: Negative for behavioral problems.      Allergies  Review of patient's allergies indicates no known allergies.  Home Medications   Prior to Admission medications   Medication Sig Start Date End Date Taking? Authorizing Provider  amiodarone (PACERONE) 200 MG tablet Take 1 tablet (200 mg total) by mouth daily. 04/23/13  Yes Charolette Forward, MD  atorvastatin (LIPITOR) 20 MG tablet Take 1 tablet (20 mg total) by mouth daily at 6 PM. 04/23/13  Yes Charolette Forward, MD  carvedilol (COREG) 6.25 MG tablet Take 1 tablet (6.25 mg total) by mouth 2 (two) times daily with a meal. 04/23/13  Yes Charolette Forward, MD  nitroGLYCERIN (NITROSTAT) 0.4 MG SL tablet Place 1 tablet (0.4 mg total) under the tongue every 5 (five) minutes x 3 doses as needed for chest pain. 04/23/13  Yes Charolette Forward, MD  omeprazole (PRILOSEC) 20 MG capsule Take 20 mg by mouth daily.   Yes Historical Provider, MD  ramipril (ALTACE) 2.5 MG capsule Take 2.5 mg by mouth daily.   Yes Historical Provider, MD  spironolactone (ALDACTONE) 25 MG tablet Take 25 mg by mouth daily.   Yes Historical Provider, MD  warfarin (COUMADIN) 5 MG tablet Take  5 mg by mouth daily at 6 PM.    Yes Historical Provider, MD  levofloxacin (LEVAQUIN) 500 MG tablet Take 1 tablet (500 mg total) by mouth daily. Patient not taking: Reported on 09/10/2014 04/23/13   Charolette Forward, MD   BP 137/77 mmHg  Pulse 84  Temp(Src) 98.5 F (36.9 C) (Oral)  Resp 16  Ht 5\' 11"  (1.803 m)  Wt 180 lb (81.647 kg)  BMI 25.12 kg/m2  SpO2 98% Physical Exam  Constitutional: He is oriented to person, place, and time. He appears well-developed and well-nourished. No distress.  HENT:  Head:  Normocephalic and atraumatic.  Mouth/Throat: No oropharyngeal exudate.  Left-sided rhino rocket in place, without evidence of bleeding.  No erythema, swelling or pain to the area.    Eyes: Conjunctivae are normal. Pupils are equal, round, and reactive to light. No scleral icterus.  Neck: Normal range of motion. No tracheal deviation present. No thyromegaly present.  Cardiovascular: Normal rate, regular rhythm and normal heart sounds.  Exam reveals no gallop and no friction rub.   No murmur heard. Pulmonary/Chest: Effort normal and breath sounds normal. No stridor. No respiratory distress. He has no wheezes. He has no rales. He exhibits no tenderness.  Abdominal: Soft. He exhibits no distension and no mass. There is no tenderness. There is no rebound and no guarding.  Musculoskeletal: Normal range of motion. He exhibits no edema.  Neurological: He is alert and oriented to person, place, and time.  Skin: Skin is warm and dry. He is not diaphoretic.    ED Course  EPISTAXIS MANAGEMENT Date/Time: 09/10/2014 3:54 PM Performed by: Doy Hutching Authorized by: Doy Hutching Consent: Verbal consent obtained. Risks and benefits: risks, benefits and alternatives were discussed Consent given by: patient Patient understanding: patient states understanding of the procedure being performed Patient consent: the patient's understanding of the procedure matches consent given Procedure consent: procedure consent matches procedure scheduled Relevant documents: relevant documents present and verified Test results: test results available and properly labeled Imaging studies: imaging studies available Patient identity confirmed: verbally with patient Time out: Immediately prior to procedure a "time out" was called to verify the correct patient, procedure, equipment, support staff and site/side marked as required. Treatment site: left anterior (packing removed after thorough saturation with normal  saline) Post-procedure assessment: bleeding stopped Patient tolerance: Patient tolerated the procedure well with no immediate complications   (including critical care time) Labs Review Labs Reviewed - No data to display  Imaging Review No results found.   EKG Interpretation None      MDM   Final diagnoses:  Epistaxis, recurrent    This is a 81 yo male with PMH HTN, a fib, DVT, on coumadin, presenting today for check of nasal packing.  Pt suffered epistaxis 5 days ago, presented here, left-sided packing was placed.  Epistaxis resolved.  No additional meds were taken.  INR was therapeutic.  Presented 4 days ago to this ED for pain associated with packing.  2 cc air were removed from packing which alleviated pain.  Pt instructed to return 5 days after initial presentation for recheck.  At this time, pt denies bleeding or pain.  Negative for bleeding from gums or any other places.  Negative for CP, SOB, LE swelling or pain.   No evidence of bleeding on exam. Negative for signs or symptoms of anemia.  Repeat INR not indicated.  Pt has no complaints, desires removal of packing.  As above, nasal balloon was saturated well with normal saline  and was removed without complication.  Pt was observed for over a half hour without recurrence of bleeding. Pt remains asymptomatic.  Pt stable for discharge.  All questions answered.  Return precautions given.  I have discussed case and care has been guided by my attending physician, Dr. Reather Converse.  Doy Hutching, MD 09/11/14 1028  Elnora Morrison, MD 09/13/14 1050

## 2015-02-03 ENCOUNTER — Emergency Department (HOSPITAL_COMMUNITY): Payer: Medicare HMO

## 2015-02-03 ENCOUNTER — Observation Stay (HOSPITAL_COMMUNITY)
Admission: EM | Admit: 2015-02-03 | Discharge: 2015-02-06 | Disposition: A | Discharge status: Home / Self Care | Payer: Medicare HMO | Attending: Cardiology | Admitting: Cardiology

## 2015-02-03 ENCOUNTER — Encounter (HOSPITAL_COMMUNITY): Payer: Self-pay | Admitting: Emergency Medicine

## 2015-02-03 DIAGNOSIS — I420 Dilated cardiomyopathy: Secondary | ICD-10-CM | POA: Insufficient documentation

## 2015-02-03 DIAGNOSIS — Z87891 Personal history of nicotine dependence: Secondary | ICD-10-CM | POA: Insufficient documentation

## 2015-02-03 DIAGNOSIS — M19011 Primary osteoarthritis, right shoulder: Principal | ICD-10-CM | POA: Insufficient documentation

## 2015-02-03 DIAGNOSIS — F0390 Unspecified dementia without behavioral disturbance: Secondary | ICD-10-CM | POA: Diagnosis not present

## 2015-02-03 DIAGNOSIS — I5022 Chronic systolic (congestive) heart failure: Secondary | ICD-10-CM | POA: Diagnosis not present

## 2015-02-03 DIAGNOSIS — M25511 Pain in right shoulder: Secondary | ICD-10-CM | POA: Diagnosis present

## 2015-02-03 DIAGNOSIS — I482 Chronic atrial fibrillation: Secondary | ICD-10-CM | POA: Insufficient documentation

## 2015-02-03 DIAGNOSIS — I129 Hypertensive chronic kidney disease with stage 1 through stage 4 chronic kidney disease, or unspecified chronic kidney disease: Secondary | ICD-10-CM | POA: Insufficient documentation

## 2015-02-03 DIAGNOSIS — N390 Urinary tract infection, site not specified: Secondary | ICD-10-CM | POA: Insufficient documentation

## 2015-02-03 DIAGNOSIS — I739 Peripheral vascular disease, unspecified: Secondary | ICD-10-CM | POA: Diagnosis not present

## 2015-02-03 DIAGNOSIS — Z89611 Acquired absence of right leg above knee: Secondary | ICD-10-CM | POA: Diagnosis not present

## 2015-02-03 DIAGNOSIS — N183 Chronic kidney disease, stage 3 (moderate): Secondary | ICD-10-CM | POA: Diagnosis not present

## 2015-02-03 DIAGNOSIS — R5383 Other fatigue: Secondary | ICD-10-CM

## 2015-02-03 DIAGNOSIS — Z8546 Personal history of malignant neoplasm of prostate: Secondary | ICD-10-CM | POA: Diagnosis not present

## 2015-02-03 LAB — URINE MICROSCOPIC-ADD ON

## 2015-02-03 LAB — CBC WITH DIFFERENTIAL/PLATELET
BASOS ABS: 0 10*3/uL (ref 0.0–0.1)
Basophils Absolute: 0 10*3/uL (ref 0.0–0.1)
Basophils Relative: 0 % (ref 0–1)
Basophils Relative: 0 % (ref 0–1)
EOS ABS: 0.1 10*3/uL (ref 0.0–0.7)
EOS PCT: 1 % (ref 0–5)
Eosinophils Absolute: 0.2 10*3/uL (ref 0.0–0.7)
Eosinophils Relative: 1 % (ref 0–5)
HCT: 50.1 % (ref 39.0–52.0)
HCT: 46.2 % (ref 39.0–52.0)
Hemoglobin: 16.6 g/dL (ref 13.0–17.0)
Hemoglobin: 15.4 g/dL (ref 13.0–17.0)
LYMPHS ABS: 1.9 10*3/uL (ref 0.7–4.0)
LYMPHS PCT: 18 % (ref 12–46)
LYMPHS PCT: 17 % (ref 12–46)
Lymphs Abs: 1.9 10*3/uL (ref 0.7–4.0)
MCH: 30.2 pg (ref 26.0–34.0)
MCH: 30 pg (ref 26.0–34.0)
MCHC: 33.1 g/dL (ref 30.0–36.0)
MCHC: 33.3 g/dL (ref 30.0–36.0)
MCV: 91.1 fL (ref 78.0–100.0)
MCV: 90.1 fL (ref 78.0–100.0)
MONO ABS: 1.3 10*3/uL — AB (ref 0.1–1.0)
MONO ABS: 1.5 10*3/uL — AB (ref 0.1–1.0)
MONOS PCT: 13 % — AB (ref 3–12)
Monocytes Relative: 12 % (ref 3–12)
Neutro Abs: 7.2 10*3/uL (ref 1.7–7.7)
Neutro Abs: 7.9 10*3/uL — ABNORMAL HIGH (ref 1.7–7.7)
Neutrophils Relative %: 69 % (ref 43–77)
Neutrophils Relative %: 69 % (ref 43–77)
PLATELETS: 176 10*3/uL (ref 150–400)
Platelets: 204 10*3/uL (ref 150–400)
RBC: 5.5 MIL/uL (ref 4.22–5.81)
RBC: 5.13 MIL/uL (ref 4.22–5.81)
RDW: 15.8 % — ABNORMAL HIGH (ref 11.5–15.5)
RDW: 15.7 % — AB (ref 11.5–15.5)
WBC: 10.5 10*3/uL (ref 4.0–10.5)
WBC: 11.4 10*3/uL — AB (ref 4.0–10.5)

## 2015-02-03 LAB — COMPREHENSIVE METABOLIC PANEL
ALK PHOS: 73 U/L (ref 38–126)
ALT: 18 U/L (ref 17–63)
AST: 19 U/L (ref 15–41)
Albumin: 3 g/dL — ABNORMAL LOW (ref 3.5–5.0)
Anion gap: 9 (ref 5–15)
BUN: 28 mg/dL — AB (ref 6–20)
CALCIUM: 9.2 mg/dL (ref 8.9–10.3)
CO2: 21 mmol/L — AB (ref 22–32)
CREATININE: 1.42 mg/dL — AB (ref 0.61–1.24)
Chloride: 109 mmol/L (ref 101–111)
GFR calc non Af Amer: 44 mL/min — ABNORMAL LOW (ref >60)
GFR, EST AFRICAN AMERICAN: 52 mL/min — AB (ref >60)
Glucose, Bld: 160 mg/dL — ABNORMAL HIGH (ref 65–99)
Potassium: 4.2 mmol/L (ref 3.5–5.1)
SODIUM: 139 mmol/L (ref 135–145)
Total Bilirubin: 0.5 mg/dL (ref 0.3–1.2)
Total Protein: 7.2 g/dL (ref 6.5–8.1)

## 2015-02-03 LAB — I-STAT TROPONIN, ED
Troponin i, poc: 0.02 ng/mL (ref 0.00–0.08)
Troponin i, poc: 0.03 ng/mL (ref 0.00–0.08)
Troponin i, poc: 0.03 ng/mL (ref 0.00–0.08)

## 2015-02-03 LAB — URINALYSIS, ROUTINE W REFLEX MICROSCOPIC
Bilirubin Urine: NEGATIVE
Glucose, UA: NEGATIVE mg/dL
KETONES UR: NEGATIVE mg/dL
NITRITE: NEGATIVE
Protein, ur: 30 mg/dL — AB
Specific Gravity, Urine: 1.017 (ref 1.005–1.030)
Urobilinogen, UA: 1 mg/dL (ref 0.0–1.0)
pH: 6 (ref 5.0–8.0)

## 2015-02-03 LAB — PROTIME-INR
INR: 1.98 — AB (ref 0.00–1.49)
Prothrombin Time: 22.4 seconds — ABNORMAL HIGH (ref 11.6–15.2)

## 2015-02-03 LAB — TSH: TSH: 1.197 u[IU]/mL (ref 0.350–4.500)

## 2015-02-03 LAB — BASIC METABOLIC PANEL
Anion gap: 8 (ref 5–15)
BUN: 23 mg/dL — ABNORMAL HIGH (ref 6–20)
CO2: 26 mmol/L (ref 22–32)
Calcium: 9.8 mg/dL (ref 8.9–10.3)
Chloride: 106 mmol/L (ref 101–111)
Creatinine, Ser: 1.56 mg/dL — ABNORMAL HIGH (ref 0.61–1.24)
GFR calc non Af Amer: 40 mL/min — ABNORMAL LOW (ref >60)
GFR, EST AFRICAN AMERICAN: 46 mL/min — AB (ref >60)
Glucose, Bld: 155 mg/dL — ABNORMAL HIGH (ref 65–99)
POTASSIUM: 4.9 mmol/L (ref 3.5–5.1)
SODIUM: 140 mmol/L (ref 135–145)

## 2015-02-03 LAB — I-STAT CG4 LACTIC ACID, ED: LACTIC ACID, VENOUS: 1.37 mmol/L (ref 0.5–2.0)

## 2015-02-03 LAB — AMMONIA: AMMONIA: 25 umol/L (ref 9–35)

## 2015-02-03 MED ORDER — AMIODARONE HCL 200 MG PO TABS
200.0000 mg | ORAL_TABLET | Freq: Every day | ORAL | Status: DC
  Administered 2015-02-03 – 2015-02-06 (×4): 200 mg via ORAL
  Filled 2015-02-03 (×4): qty 1

## 2015-02-03 MED ORDER — CETYLPYRIDINIUM CHLORIDE 0.05 % MT LIQD
7.0000 mL | Freq: Two times a day (BID) | OROMUCOSAL | Status: DC
  Administered 2015-02-04 – 2015-02-06 (×5): 7 mL via OROMUCOSAL

## 2015-02-03 MED ORDER — NITROGLYCERIN 0.4 MG SL SUBL: 0.4000 mg | SUBLINGUAL_TABLET | SUBLINGUAL | Status: DC | PRN

## 2015-02-03 MED ORDER — DEXTROSE 5 % IV SOLN
1.0000 g | Freq: Once | INTRAVENOUS | Status: AC
  Administered 2015-02-03: 1 g via INTRAVENOUS
  Filled 2015-02-03: qty 10

## 2015-02-03 MED ORDER — SODIUM CHLORIDE 0.9 % IV BOLUS (SEPSIS)
1000.0000 mL | Freq: Once | INTRAVENOUS | Status: AC
  Administered 2015-02-03: 1000 mL via INTRAVENOUS

## 2015-02-03 MED ORDER — SODIUM CHLORIDE 0.9 % IV SOLN
INTRAVENOUS | Status: DC
  Administered 2015-02-03 – 2015-02-05 (×5): via INTRAVENOUS

## 2015-02-03 MED ORDER — FENTANYL CITRATE (PF) 100 MCG/2ML IJ SOLN
25.0000 ug | Freq: Once | INTRAMUSCULAR | Status: DC
Start: 2015-02-03 — End: 2015-02-06
  Filled 2015-02-03: qty 2

## 2015-02-03 MED ORDER — ATORVASTATIN CALCIUM 20 MG PO TABS
20.0000 mg | ORAL_TABLET | Freq: Every day | ORAL | Status: DC
  Administered 2015-02-04 – 2015-02-05 (×2): 20 mg via ORAL
  Filled 2015-02-03 (×2): qty 1

## 2015-02-03 MED ORDER — RAMIPRIL 2.5 MG PO CAPS
2.5000 mg | ORAL_CAPSULE | Freq: Every day | ORAL | Status: DC
  Administered 2015-02-04: 2.5 mg via ORAL
  Filled 2015-02-03: qty 1

## 2015-02-03 MED ORDER — PANTOPRAZOLE SODIUM 40 MG PO TBEC
40.0000 mg | DELAYED_RELEASE_TABLET | Freq: Every day | ORAL | Status: DC
  Administered 2015-02-04 – 2015-02-06 (×3): 40 mg via ORAL
  Filled 2015-02-03 (×3): qty 1

## 2015-02-03 MED ORDER — DEXTROSE 5 % IV SOLN
1.0000 g | INTRAVENOUS | Status: DC
  Administered 2015-02-04 – 2015-02-05 (×2): 1 g via INTRAVENOUS
  Filled 2015-02-03 (×3): qty 10

## 2015-02-03 MED ORDER — WARFARIN - PHYSICIAN DOSING INPATIENT: Freq: Every day | Status: DC

## 2015-02-03 MED ORDER — WARFARIN SODIUM 5 MG PO TABS
5.0000 mg | ORAL_TABLET | Freq: Every day | ORAL | Status: DC
  Administered 2015-02-03 – 2015-02-04 (×2): 5 mg via ORAL
  Filled 2015-02-03 (×2): qty 1

## 2015-02-03 MED ORDER — CARVEDILOL 6.25 MG PO TABS
6.2500 mg | ORAL_TABLET | Freq: Two times a day (BID) | ORAL | Status: DC
  Administered 2015-02-04 – 2015-02-06 (×5): 6.25 mg via ORAL
  Filled 2015-02-03 (×5): qty 1

## 2015-02-03 NOTE — H&P (Signed)
Cristian Miller is an 81 y.o. male.   Chief Complaint:  HPI: This*kin is 81 year old male with past medical history significant for nonischemic gallop date cardiomyopathy EF 30-35%, history of congestive heart failure due to depressed LV systolic function, chronic atrial fibrillation on chronic anticoagulation chads score of 4 peripheral vascular disease, status post right AKA in the past, chronic kidney disease stage III, history of tobacco abuse, history of left iliac thrombectomy in the past, came to the ED complaining of right shoulder pain associated with generalized weakness fatigue and inability to get out of bed And inability of caregiver to provide care. Patient was noted to have UTI but denies any urinary complaints denies any fever or chills. Denies chest pain or shortness of breath. Denies palpitation lightheadedness or syncope. Past Medical History  Diagnosis Date  . Hypertension   . Atrial fibrillation   . Cataract   . Peripheral vascular disease   . DVT (deep venous thrombosis)   . CHF (congestive heart failure)   . Cancer     prostate CA  . Leukocytosis, unspecified 04/21/2013    Past Surgical History  Procedure Laterality Date  . Leg amputation    . Cataract extraction    . Pr vein bypass graft,aorto-fem-pop      2009  right axillary bifemoral bypass and bilateral iliofemoral thrombectomies  . Abdominal aortagram N/A 03/15/2012    Procedure: ABDOMINAL Maxcine Ham;  Surgeon: Elam Dutch, MD;  Location: [facility] CATH LAB;  Service: Cardiovascular;  Laterality: N/A;    No family history on file. Social History:  reports that he quit smoking about 23 years ago. He has never used smokeless tobacco. He reports that he does not drink alcohol or use illicit drugs.  Allergies: No Known Allergies   (Not in a hospital admission)  Results for orders placed or performed during the hospital encounter of 02/03/15 (from the past 48 hour(s))  CBC with Differential/Platelet     Status:  Abnormal   Collection Time: 02/03/15 10:50 AM  Result Value Ref Range   WBC 10.5 4.0 - 10.5 K/uL   RBC 5.50 4.22 - 5.81 MIL/uL   Hemoglobin 16.6 13.0 - 17.0 g/dL   HCT 50.1 39.0 - 52.0 %   MCV 91.1 78.0 - 100.0 fL   MCH 30.2 26.0 - 34.0 pg   MCHC 33.1 30.0 - 36.0 g/dL   RDW 15.8 (H) 11.5 - 15.5 %   Platelets 204 150 - 400 K/uL   Neutrophils Relative % 69 43 - 77 %   Neutro Abs 7.2 1.7 - 7.7 K/uL   Lymphocytes Relative 18 12 - 46 %   Lymphs Abs 1.9 0.7 - 4.0 K/uL   Monocytes Relative 12 3 - 12 %   Monocytes Absolute 1.3 (H) 0.1 - 1.0 K/uL   Eosinophils Relative 1 0 - 5 %   Eosinophils Absolute 0.2 0.0 - 0.7 K/uL   Basophils Relative 0 0 - 1 %   Basophils Absolute 0.0 0.0 - 0.1 K/uL  Basic metabolic panel     Status: Abnormal   Collection Time: 02/03/15 10:50 AM  Result Value Ref Range   Sodium 140 135 - 145 mmol/L   Potassium 4.9 3.5 - 5.1 mmol/L    Comment: SPECIMEN HEMOLYZED. HEMOLYSIS MAY AFFECT INTEGRITY OF RESULTS.   Chloride 106 101 - 111 mmol/L   CO2 26 22 - 32 mmol/L   Glucose, Bld 155 (H) 65 - 99 mg/dL   BUN 23 (H) 6 - 20 mg/dL  Creatinine, Ser 1.56 (H) 0.61 - 1.24 mg/dL   Calcium 9.8 8.9 - 10.3 mg/dL   GFR calc non Af Amer 40 (L) >60 mL/min   GFR calc Af Amer 46 (L) >60 mL/min    Comment: (NOTE) The eGFR has been calculated using the CKD EPI equation. This calculation has not been validated in all clinical situations. eGFR's persistently <60 mL/min signify possible Chronic Kidney Disease.    Anion gap 8 5 - 15  Ammonia     Status: None   Collection Time: 02/03/15 10:50 AM  Result Value Ref Range   Ammonia 25 9 - 35 umol/L  Protime-INR     Status: Abnormal   Collection Time: 02/03/15 10:50 AM  Result Value Ref Range   Prothrombin Time 22.4 (H) 11.6 - 15.2 seconds   INR 1.98 (H) 0.00 - 1.49  I-stat troponin, ED     Status: None   Collection Time: 02/03/15 11:00 AM  Result Value Ref Range   Troponin i, poc 0.02 0.00 - 0.08 ng/mL   Comment 3             Comment: Due to the release kinetics of cTnI, a negative result within the first hours of the onset of symptoms does not rule out myocardial infarction with certainty. If myocardial infarction is still suspected, repeat the test at appropriate intervals.   I-Stat CG4 Lactic Acid, ED     Status: None   Collection Time: 02/03/15 11:02 AM  Result Value Ref Range   Lactic Acid, Venous 1.37 0.5 - 2.0 mmol/L  Urinalysis, Routine w reflex microscopic (not at [facility])     Status: Abnormal   Collection Time: 02/03/15  1:26 PM  Result Value Ref Range   Color, Urine YELLOW YELLOW   APPearance TURBID (A) CLEAR   Specific Gravity, Urine 1.017 1.005 - 1.030   pH 6.0 5.0 - 8.0   Glucose, UA NEGATIVE NEGATIVE mg/dL   Hgb urine dipstick LARGE (A) NEGATIVE   Bilirubin Urine NEGATIVE NEGATIVE   Ketones, ur NEGATIVE NEGATIVE mg/dL   Protein, ur 30 (A) NEGATIVE mg/dL   Urobilinogen, UA 1.0 0.0 - 1.0 mg/dL   Nitrite NEGATIVE NEGATIVE   Leukocytes, UA LARGE (A) NEGATIVE  Urine microscopic-add on     Status: Abnormal   Collection Time: 02/03/15  1:26 PM  Result Value Ref Range   WBC, UA TOO NUMEROUS TO COUNT <3 WBC/hpf   RBC / HPF 0-2 <3 RBC/hpf   Bacteria, UA MANY (A) RARE   Urine-Other LESS THAN 10 mL OF URINE SUBMITTED   I-stat troponin, ED     Status: None   Collection Time: 02/03/15  2:01 PM  Result Value Ref Range   Troponin i, poc 0.03 0.00 - 0.08 ng/mL   Comment 3            Comment: Due to the release kinetics of cTnI, a negative result within the first hours of the onset of symptoms does not rule out myocardial infarction with certainty. If myocardial infarction is still suspected, repeat the test at appropriate intervals.   I-stat troponin, ED     Status: None   Collection Time: 02/03/15  4:29 PM  Result Value Ref Range   Troponin i, poc 0.03 0.00 - 0.08 ng/mL   Comment 3            Comment: Due to the release kinetics of cTnI, a negative result within the first hours of the onset  of symptoms does  not rule out myocardial infarction with certainty. If myocardial infarction is still suspected, repeat the test at appropriate intervals.    Ct Head Wo Contrast  02/03/2015   CLINICAL DATA:  81 year old male with fatigue  EXAM: CT HEAD WITHOUT CONTRAST  TECHNIQUE: Contiguous axial images were obtained from the base of the skull through the vertex without intravenous contrast.  COMPARISON:  Prior CT scan of the head 07/17/2002  FINDINGS: Negative for acute intracranial hemorrhage, acute infarction, mass, mass effect, hydrocephalus or midline shift. Gray-white differentiation is preserved throughout. Moderately advanced atrophy with mild ex vacuo ventriculomegaly. Lacunar infarcts in both caudate heads. Sequelae of remote left anterior MCA territory infarct. No focal soft tissue or calvarial abnormality. Globes and orbits are symmetric bilaterally. Normal aeration of the bilateral mastoid air cells and visualized paranasal sinuses. Scattered atherosclerotic calcifications in both carotid arteries.  IMPRESSION: 1. No evidence of acute intracranial abnormality. 2. Moderately advanced cerebral atrophy with ex vacuo ventriculomegaly. 3. Moderately advanced chronic microvascular ischemic white matter disease and bilateral basal ganglia lacunar infarcts. 4. Sequelae of remote prior left anterior MCA territory infarct. 5. Intracranial atherosclerosis.   Electronically Signed   By: Jacqulynn Cadet M.D.   On: 02/03/2015 11:51   Dg Chest Portable 1 View  02/03/2015   CLINICAL DATA:  Right shoulder pain.  Increasing weakness.  EXAM: PORTABLE CHEST - 1 VIEW  COMPARISON:  04/18/2013.  FINDINGS: Mildly enlarged cardiac silhouette with mild improvement. Clear lungs with normal vascularity. Right axillary surgical clips. Mild right shoulder degenerative changes.  IMPRESSION: No acute abnormality.   Electronically Signed   By: Claudie Revering M.D.   On: 02/03/2015 11:04   Dg Shoulder Right Port  02/03/2015    CLINICAL DATA:  81 year old male with right shoulder pain since January 27, 2015.  EXAM: PORTABLE RIGHT SHOULDER - 2+ VIEW  COMPARISON:  No priors.  FINDINGS: Two views of the right shoulder demonstrate no acute displaced fracture or dislocation. High-riding humeral head, suggesting underlying rotator cuff pathology. Degenerative changes of osteoarthritis in the right acromioclavicular joint (moderate) and the right glenohumeral joint (mild). Surgical clips in the right axilla.  IMPRESSION: 1. No acute radiographic abnormality of the right shoulder. 2. Clinic humeral and acromioclavicular joint osteoarthritis, with high-riding humeral head suggesting underlying rotator cuff pathology.   Electronically Signed   By: Vinnie Langton M.D.   On: 02/03/2015 11:05    Review of Systems  Constitutional: Positive for malaise/fatigue. Negative for fever and chills.  HENT: Negative for hearing loss.   Eyes: Negative for double vision and photophobia.  Respiratory: Negative for cough, sputum production and shortness of breath.   Cardiovascular: Negative for chest pain and palpitations.  Gastrointestinal: Negative for vomiting and abdominal pain.  Neurological: Positive for weakness. Negative for dizziness and headaches.    Blood pressure 125/75, pulse 58, temperature 97.4 F (36.3 C), temperature source Oral, resp. rate 24, SpO2 91 %. Physical Exam  Constitutional: He is oriented to person, place, and time.  HENT:  Head: Normocephalic and atraumatic.  Eyes: Conjunctivae are normal. Pupils are equal, round, and reactive to light. Left eye exhibits no discharge.  Neck: Normal range of motion. Neck supple. No JVD present. No tracheal deviation present. No thyromegaly present.  Cardiovascular:  Irregularly irregular tachycardic soft systolic murmur noted  Respiratory: Effort normal and breath sounds normal. No respiratory distress. He has no wheezes. He has no rales.  GI: Soft. Bowel sounds are normal. He  exhibits no distension. There is no tenderness.  There is no rebound.  Musculoskeletal: He exhibits no edema or tenderness.  Neurological: He is alert and oriented to person, place, and time.     Assessment/Plan Degenerative joint disease right shoulder rule out rotator cuff injury UTI Nonischemic dilated cardiomyopathy Compensated systolic congestive heart failure Chronic atrial fibrillation with moderate ventricular response Peripheral vascular disease status post right AKA History of tobacco abuse Left iliac thrombectomy in the past Mild dementia Chronic kidney disease stage III Plan As per orders  Charolette Forward 02/03/2015, 6:15 PM

## 2015-02-03 NOTE — ED Provider Notes (Signed)
CSN: [id number]     Arrival date & time 02/03/15  6629 History   First MD Initiated Contact with Patient 02/03/15 1001     Chief Complaint  Patient presents with  . Weakness  . Shoulder Pain     (Consider location/radiation/quality/duration/timing/severity/associated sxs/prior Treatment) Patient is a 81 y.o. male presenting with shoulder pain.  Shoulder Pain Location:  Shoulder Time since incident:  1 week Injury: no   Shoulder location:  R shoulder Pain details:    Quality:  Aching   Radiates to:  Chest   Severity:  Severe   Onset quality:  Gradual   Duration:  1 week   Timing:  Constant   Progression:  Unchanged Chronicity:  New Dislocation: no   Prior injury to area:  No Relieved by:  None tried Worsened by:  Movement Ineffective treatments:  None tried Associated symptoms: decreased range of motion, fatigue and swelling   Associated symptoms: no fever and no muscle weakness   Risk factors: no recent illness     Past Medical History  Diagnosis Date  . Hypertension   . Atrial fibrillation   . Cataract   . Peripheral vascular disease   . DVT (deep venous thrombosis)   . CHF (congestive heart failure)   . Cancer     prostate CA  . Leukocytosis, unspecified 04/21/2013   Past Surgical History  Procedure Laterality Date  . Leg amputation    . Cataract extraction    . Pr vein bypass graft,aorto-fem-pop      2009  right axillary bifemoral bypass and bilateral iliofemoral thrombectomies  . Abdominal aortagram N/A 03/15/2012    Procedure: ABDOMINAL Maxcine Ham;  Surgeon: Elam Dutch, MD;  Location: [facility] CATH LAB;  Service: Cardiovascular;  Laterality: N/A;   No family history on file. Social History  Substance Use Topics  . Smoking status: Former Smoker    Quit date: 06/01/1991  . Smokeless tobacco: Never Used  . Alcohol Use: No    Review of Systems  Constitutional: Positive for fatigue. Negative for fever, chills and appetite change.  HENT: Negative for  congestion and sore throat.   Eyes: Negative for visual disturbance.  Respiratory: Negative for shortness of breath and wheezing.   Cardiovascular: Negative for chest pain.  Gastrointestinal: Negative for nausea, vomiting, abdominal pain, diarrhea and constipation.  Genitourinary: Negative for dysuria and difficulty urinating.  Musculoskeletal: Positive for joint swelling and arthralgias.  Skin: Negative for wound.  Neurological: Negative for syncope and headaches.  Psychiatric/Behavioral: Negative for behavioral problems.  All other systems reviewed and are negative.     Allergies  Review of patient's allergies indicates no known allergies.  Home Medications   Prior to Admission medications   Medication Sig Start Date End Date Taking? Authorizing Provider  amiodarone (PACERONE) 200 MG tablet Take 1 tablet (200 mg total) by mouth daily. 04/23/13  Yes Charolette Forward, MD  atorvastatin (LIPITOR) 20 MG tablet Take 1 tablet (20 mg total) by mouth daily at 6 PM. 04/23/13  Yes Charolette Forward, MD  carvedilol (COREG) 6.25 MG tablet Take 1 tablet (6.25 mg total) by mouth 2 (two) times daily with a meal. 04/23/13  Yes Charolette Forward, MD  omeprazole (PRILOSEC) 20 MG capsule Take 20 mg by mouth daily.   Yes Historical Provider, MD  ramipril (ALTACE) 2.5 MG capsule Take 2.5 mg by mouth daily.   Yes Historical Provider, MD  spironolactone (ALDACTONE) 25 MG tablet Take 25 mg by mouth daily.   Yes Historical Provider,  MD  warfarin (COUMADIN) 5 MG tablet Take 5 mg by mouth daily at 6 PM.    Yes Historical Provider, MD  nitroGLYCERIN (NITROSTAT) 0.4 MG SL tablet Place 1 tablet (0.4 mg total) under the tongue every 5 (five) minutes x 3 doses as needed for chest pain. 04/23/13   Charolette Forward, MD   BP 126/94 mmHg  Pulse 94  Temp(Src) 97.4 F (36.3 C) (Oral)  Resp 14  SpO2 90% Physical Exam  Constitutional: He is oriented to person, place, and time. He appears well-developed and well-nourished.  HENT:   Head: Normocephalic and atraumatic.  Eyes: EOM are normal.  Neck: Normal range of motion.  Cardiovascular: Normal rate and normal heart sounds.  An irregularly irregular rhythm present.  No murmur heard. Pulmonary/Chest: Effort normal and breath sounds normal. No respiratory distress.  Abdominal: Soft. There is no tenderness.  Musculoskeletal: He exhibits tenderness.       Right shoulder: He exhibits decreased range of motion, bony tenderness and swelling.  R AKA 2 large masses on right bicep likely lipoma  Neurological: He is alert and oriented to person, place, and time.  Skin: No rash noted. He is not diaphoretic.    ED Course  Procedures (including critical care time) Labs Review Labs Reviewed  CBC WITH DIFFERENTIAL/PLATELET - Abnormal; Notable for the following:    RDW 15.8 (*)    Monocytes Absolute 1.3 (*)    All other components within normal limits  BASIC METABOLIC PANEL - Abnormal; Notable for the following:    Glucose, Bld 155 (*)    BUN 23 (*)    Creatinine, Ser 1.56 (*)    GFR calc non Af Amer 40 (*)    GFR calc Af Amer 46 (*)    All other components within normal limits  URINALYSIS, ROUTINE W REFLEX MICROSCOPIC (NOT AT [facility]) - Abnormal; Notable for the following:    APPearance TURBID (*)    Hgb urine dipstick LARGE (*)    Protein, ur 30 (*)    Leukocytes, UA LARGE (*)    All other components within normal limits  PROTIME-INR - Abnormal; Notable for the following:    Prothrombin Time 22.4 (*)    INR 1.98 (*)    All other components within normal limits  URINE MICROSCOPIC-ADD ON - Abnormal; Notable for the following:    Bacteria, UA MANY (*)    All other components within normal limits  URINE CULTURE  AMMONIA  I-STAT TROPOININ, ED  I-STAT CG4 LACTIC ACID, ED  I-STAT TROPOININ, ED    Imaging Review Ct Head Wo Contrast  02/03/2015   CLINICAL DATA:  81 year old male with fatigue  EXAM: CT HEAD WITHOUT CONTRAST  TECHNIQUE: Contiguous axial images were  obtained from the base of the skull through the vertex without intravenous contrast.  COMPARISON:  Prior CT scan of the head 07/17/2002  FINDINGS: Negative for acute intracranial hemorrhage, acute infarction, mass, mass effect, hydrocephalus or midline shift. Gray-white differentiation is preserved throughout. Moderately advanced atrophy with mild ex vacuo ventriculomegaly. Lacunar infarcts in both caudate heads. Sequelae of remote left anterior MCA territory infarct. No focal soft tissue or calvarial abnormality. Globes and orbits are symmetric bilaterally. Normal aeration of the bilateral mastoid air cells and visualized paranasal sinuses. Scattered atherosclerotic calcifications in both carotid arteries.  IMPRESSION: 1. No evidence of acute intracranial abnormality. 2. Moderately advanced cerebral atrophy with ex vacuo ventriculomegaly. 3. Moderately advanced chronic microvascular ischemic white matter disease and bilateral basal ganglia lacunar infarcts.  4. Sequelae of remote prior left anterior MCA territory infarct. 5. Intracranial atherosclerosis.   Electronically Signed   By: Jacqulynn Cadet M.D.   On: 02/03/2015 11:51   Dg Chest Portable 1 View  02/03/2015   CLINICAL DATA:  Right shoulder pain.  Increasing weakness.  EXAM: PORTABLE CHEST - 1 VIEW  COMPARISON:  04/18/2013.  FINDINGS: Mildly enlarged cardiac silhouette with mild improvement. Clear lungs with normal vascularity. Right axillary surgical clips. Mild right shoulder degenerative changes.  IMPRESSION: No acute abnormality.   Electronically Signed   By: Claudie Revering M.D.   On: 02/03/2015 11:04   Dg Shoulder Right Port  02/03/2015   CLINICAL DATA:  81 year old male with right shoulder pain since January 27, 2015.  EXAM: PORTABLE RIGHT SHOULDER - 2+ VIEW  COMPARISON:  No priors.  FINDINGS: Two views of the right shoulder demonstrate no acute displaced fracture or dislocation. High-riding humeral head, suggesting underlying rotator cuff pathology.  Degenerative changes of osteoarthritis in the right acromioclavicular joint (moderate) and the right glenohumeral joint (mild). Surgical clips in the right axilla.  IMPRESSION: 1. No acute radiographic abnormality of the right shoulder. 2. Clinic humeral and acromioclavicular joint osteoarthritis, with high-riding humeral head suggesting underlying rotator cuff pathology.   Electronically Signed   By: Vinnie Langton M.D.   On: 02/03/2015 11:05   I have personally reviewed and evaluated these images and lab results as part of my medical decision-making.   EKG Interpretation None      MDM   Final diagnoses:  UTI (lower urinary tract infection)  Other fatigue  Right shoulder pain   Mr. Maffei is an 81 year old male with past medical history significant for nonischemic dilated cardiomyopathy, EF of 30-35%, history of congestive heart failure secondary to systolic dysfunction, chronic atrial fibrillation on chronic anticoagulants, peripheral vascular disease, status post right AKA in the past, chronic kidney disease, stage III; history of tobacco abuse, history of left iliac thrombectomy in the past. Patient presents today with 1 week of right shoulder pain, fatigue, refusal to get out of bed. The majority of the patient's history of present illness was given by caregiver. She states that one month ago he fell injuring his left shoulder and favored it however that appears to be better. Patient's one week ago complaining of right shoulder pain and refuses to move it. Patient will still E however refused to get out of bed. Upon examination the the patient is afebrile stable vital signs. Patient has a very strong aroma of urine. Patient refuses to move his right arm and he states secondary to pain. I'm able to passively range the patient's right shoulder. Patient is to large lipomas to his right bicep that he shows minimal evaluated in the past. Patient does have bony tenderness to the right shoulder.  Patient's is difficult to engage in a conversation. We will do a muscle skull workup for his right shoulder however given the patient's fatigue and refusal to get up will also do a full medical screening evaluation to look for other causes. The patient's EKG is atrial fibrillation with evidence of lateral ischemia in the past with Q waves in the high lateral leads that were not present 2 years ago. Patient currently denies chest pain.    Patient's workup significant for a significant urinary tract infection. Given the patient's inability to transfer from bed to wheelchair and his provider unable to care for him at this time we will provide IV antibiotics, admit to hospital for further  management of care. Patient's renal function is stable. Patient has a negative troponin. Patient's lactic acid is reassuring at 1.3. Patient's shoulder x-ray shows no signs of acute abnormality.  Renne Musca, MD 02/03/15 1553  Elnora Morrison, MD 02/06/15 [phone]

## 2015-02-03 NOTE — ED Notes (Signed)
Pt refused pain medicine at present.

## 2015-02-03 NOTE — ED Notes (Signed)
Pt to ED via [facility] from home, with c/o increasing weakness, not getting out of bed, since approx august 29th. Also c/o right shoulder pain. Pt has 2 lipomas on shoulder area.

## 2015-02-03 NOTE — ED Notes (Signed)
In out cath performed by [facility] EMT. Sterile technique observed by Delia Chimes. Peri-care performed by Erlene Quan EMT.

## 2015-02-03 NOTE — Progress Notes (Signed)
ED CM consulted y EDP for possible [facility] services. Patient presented to [facility] ED today with complaints of weakness, and shoulder pain s/p fall. Reviewed record, 4 ED visits in the past 6 months and no PCP listed in record.  Patient has a AKA and uses a w/c for mobility. [employer] listed as payor source. Patient lives at home and has a private duty caregiver Ebony Hail [phone]. Discussed recommendations for possible [facility] services but, ED evaluation still pending.  CM will continue to follow up for disposition plan.

## 2015-02-03 NOTE — ED Notes (Signed)
Spoke with Ebony Hail-- caregiver-- [phone].

## 2015-02-03 NOTE — ED Notes (Signed)
Patient eating meal at this time, asked patient if he would like assistance with eating meal and pt denies

## 2015-02-03 NOTE — ED Notes (Signed)
Report attempted 

## 2015-02-03 NOTE — ED Notes (Signed)
Report attempted again. 

## 2015-02-04 LAB — BASIC METABOLIC PANEL
Anion gap: 8 (ref 5–15)
BUN: 28 mg/dL — AB (ref 6–20)
CHLORIDE: 108 mmol/L (ref 101–111)
CO2: 25 mmol/L (ref 22–32)
CREATININE: 1.5 mg/dL — AB (ref 0.61–1.24)
Calcium: 9.2 mg/dL (ref 8.9–10.3)
GFR calc Af Amer: 48 mL/min — ABNORMAL LOW (ref >60)
GFR calc non Af Amer: 42 mL/min — ABNORMAL LOW (ref >60)
Glucose, Bld: 125 mg/dL — ABNORMAL HIGH (ref 65–99)
Potassium: 4.5 mmol/L (ref 3.5–5.1)
Sodium: 141 mmol/L (ref 135–145)

## 2015-02-04 LAB — CBC
HCT: 47.4 % (ref 39.0–52.0)
Hemoglobin: 15.7 g/dL (ref 13.0–17.0)
MCH: 30.1 pg (ref 26.0–34.0)
MCHC: 33.1 g/dL (ref 30.0–36.0)
MCV: 90.8 fL (ref 78.0–100.0)
PLATELETS: 194 10*3/uL (ref 150–400)
RBC: 5.22 MIL/uL (ref 4.22–5.81)
RDW: 16 % — AB (ref 11.5–15.5)
WBC: 9.6 10*3/uL (ref 4.0–10.5)

## 2015-02-04 LAB — PROTIME-INR
INR: 2.12 — ABNORMAL HIGH (ref 0.00–1.49)
Prothrombin Time: 23.6 seconds — ABNORMAL HIGH (ref 11.6–15.2)

## 2015-02-04 MED ORDER — RAMIPRIL 5 MG PO CAPS
5.0000 mg | ORAL_CAPSULE | Freq: Every day | ORAL | Status: DC
  Administered 2015-02-05 – 2015-02-06 (×2): 5 mg via ORAL
  Filled 2015-02-04 (×2): qty 1

## 2015-02-04 NOTE — Progress Notes (Signed)
Pt arrived to floor in NAD, some right shoulder pain with movement, patient states he has no pain if he is still in the bed. VSS. Pt given falls risk bracelet and yellow socks, educated to call RN for assistance. Right shoulder noted to have two swollen areas, also noted in report from ED RN. Will continue to monitor. Ronnette Hila, RN

## 2015-02-04 NOTE — Evaluation (Signed)
Physical Therapy Evaluation Patient Details Name: Cristian Miller MRN: [id number] DOB: 05-03-1931 Today's Date: 02/04/2015   History of Present Illness  Pt adm with UTI and rt shoulder injury (?rotator cuff injury). PMH - CHF, Rt AKA, PVD, A-fib, CKD  Clinical Impression  Pt admitted with above diagnosis and presents to PT with functional limitations due to deficits listed below (See PT problem list). Pt needs skilled PT to maximize independence and safety to allow discharge to ST-SNF.      Follow Up Recommendations SNF    Equipment Recommendations  None recommended by PT    Recommendations for Other Services       Precautions / Restrictions Precautions Precautions: Fall      Mobility  Bed Mobility Overal bed mobility: Needs Assistance Bed Mobility: Supine to Sit;Sit to Supine     Supine to sit: Max assist Sit to supine: Min assist   General bed mobility comments: Assist to bring trunk up and hips to EOB.  Transfers                 General transfer comment: Unable with 1 person assist  Ambulation/Gait                Stairs            Wheelchair Mobility    Modified Rankin (Stroke Patients Only)       Balance Overall balance assessment: Needs assistance Sitting-balance support: Single extremity supported;Feet supported Sitting balance-Leahy Scale: Poor Sitting balance - Comments: UE support for static sitting                                     Pertinent Vitals/Pain Pain Assessment: Faces Faces Pain Scale: Hurts even more Pain Location: rt shoulder Pain Descriptors / Indicators: Grimacing;Guarding Pain Intervention(s): Limited activity within patient's tolerance;Repositioned    Home Living Family/patient expects to be discharged to:: Private residence Living Arrangements: Alone Available Help at Discharge: Personal care attendant Type of Home: House Home Access: Ramped entrance     Home Layout: One level Home  Equipment: Wheelchair - manual      Prior Function Level of Independence: Needs assistance   Gait / Transfers Assistance Needed: Modified Independent with transfer to w/c until last few weeks.           Hand Dominance        Extremity/Trunk Assessment   Upper Extremity Assessment: Defer to OT evaluation           Lower Extremity Assessment: Generalized weakness         Communication      Cognition Arousal/Alertness: Awake/alert Behavior During Therapy: WFL for tasks assessed/performed Overall Cognitive Status: Within Functional Limits for tasks assessed                      General Comments      Exercises        Assessment/Plan    PT Assessment Patient needs continued PT services  PT Diagnosis Generalized weakness;Acute pain   PT Problem List Decreased strength;Decreased activity tolerance;Decreased balance;Decreased mobility;Pain  PT Treatment Interventions DME instruction;Functional mobility training;Therapeutic activities;Therapeutic exercise;Balance training;Patient/family education   PT Goals (Current goals can be found in the Care Plan section) Acute Rehab PT Goals Patient Stated Goal: Not stated PT Goal Formulation: With patient Time For Goal Achievement: 02/18/15 Potential to Achieve Goals: Fair    Frequency Min 3X/week  Barriers to discharge        Co-evaluation               End of Session   Activity Tolerance: Patient tolerated treatment well;Patient limited by pain Patient left: in bed;with call bell/phone within reach;with bed alarm set;with nursing/sitter in room Nurse Communication: Mobility status         Time: 6256-3893 PT Time Calculation (min) (ACUTE ONLY): 14 min   Charges:   PT Evaluation $Initial PT Evaluation Tier I: 1 Procedure     PT G Codes:        Yadier Bramhall 02/28/15, 3:57 PM  [facility] PT [phone]

## 2015-02-04 NOTE — Progress Notes (Signed)
Subjective:  Patient denies any chest pain or shortness of breath states feels better  Objective:  Vital Signs in the last 24 hours: Temp:  [97.2 F (36.2 C)-98.4 F (36.9 C)] 98.4 F (36.9 C) (09/06 0943) Pulse Rate:  [28-123] 53 (09/06 0943) Resp:  [18-24] 18 (09/06 0943) BP: (108-150)/(67-100) 150/100 mmHg (09/06 0943) SpO2:  [91 %-100 %] 97 % (09/06 0943) Weight:  [71.5 kg (157 lb 10.1 oz)-72 kg (158 lb 11.7 oz)] 71.5 kg (157 lb 10.1 oz) (09/06 0331)  Intake/Output from previous day: 09/05 0701 - 09/06 0700 In: 420 [P.O.:120; I.V.:300] Out: 0  Intake/Output from this shift: Total I/O In: 480 [P.O.:480] Out: -   Physical Exam: Neck: no adenopathy, no carotid bruit, no JVD and supple, symmetrical, trachea midline Lungs: clear to auscultation bilaterally Heart: irregularly irregular rhythm, S1, S2 normal and Soft systolic murmur noted Abdomen: soft, non-tender; bowel sounds normal; no masses,  no organomegaly Extremities: Right AKA  Lab Results:  Recent Labs  02/03/15 2230 02/04/15 0319  WBC 11.4* 9.6  HGB 15.4 15.7  PLT 176 194    Recent Labs  02/03/15 2230 02/04/15 0319  NA 139 141  K 4.2 4.5  CL 109 108  CO2 21* 25  GLUCOSE 160* 125*  BUN 28* 28*  CREATININE 1.42* 1.50*   No results for input(s): TROPONINI in the last 72 hours.  Invalid input(s): CK, MB Hepatic Function Panel  Recent Labs  02/03/15 2230  PROT 7.2  ALBUMIN 3.0*  AST 19  ALT 18  ALKPHOS 73  BILITOT 0.5   No results for input(s): CHOL in the last 72 hours. No results for input(s): PROTIME in the last 72 hours.  Imaging: Imaging results have been reviewed and Ct Head Wo Contrast  02/03/2015   CLINICAL DATA:  81 year old male with fatigue  EXAM: CT HEAD WITHOUT CONTRAST  TECHNIQUE: Contiguous axial images were obtained from the base of the skull through the vertex without intravenous contrast.  COMPARISON:  Prior CT scan of the head 07/17/2002  FINDINGS: Negative for acute  intracranial hemorrhage, acute infarction, mass, mass effect, hydrocephalus or midline shift. Gray-white differentiation is preserved throughout. Moderately advanced atrophy with mild ex vacuo ventriculomegaly. Lacunar infarcts in both caudate heads. Sequelae of remote left anterior MCA territory infarct. No focal soft tissue or calvarial abnormality. Globes and orbits are symmetric bilaterally. Normal aeration of the bilateral mastoid air cells and visualized paranasal sinuses. Scattered atherosclerotic calcifications in both carotid arteries.  IMPRESSION: 1. No evidence of acute intracranial abnormality. 2. Moderately advanced cerebral atrophy with ex vacuo ventriculomegaly. 3. Moderately advanced chronic microvascular ischemic white matter disease and bilateral basal ganglia lacunar infarcts. 4. Sequelae of remote prior left anterior MCA territory infarct. 5. Intracranial atherosclerosis.   Electronically Signed   By: Jacqulynn Cadet M.D.   On: 02/03/2015 11:51   Dg Chest Portable 1 View  02/03/2015   CLINICAL DATA:  Right shoulder pain.  Increasing weakness.  EXAM: PORTABLE CHEST - 1 VIEW  COMPARISON:  04/18/2013.  FINDINGS: Mildly enlarged cardiac silhouette with mild improvement. Clear lungs with normal vascularity. Right axillary surgical clips. Mild right shoulder degenerative changes.  IMPRESSION: No acute abnormality.   Electronically Signed   By: Claudie Revering M.D.   On: 02/03/2015 11:04   Dg Shoulder Right Port  02/03/2015   CLINICAL DATA:  81 year old male with right shoulder pain since January 27, 2015.  EXAM: PORTABLE RIGHT SHOULDER - 2+ VIEW  COMPARISON:  No priors.  FINDINGS:  Two views of the right shoulder demonstrate no acute displaced fracture or dislocation. High-riding humeral head, suggesting underlying rotator cuff pathology. Degenerative changes of osteoarthritis in the right acromioclavicular joint (moderate) and the right glenohumeral joint (mild). Surgical clips in the right axilla.   IMPRESSION: 1. No acute radiographic abnormality of the right shoulder. 2. Clinic humeral and acromioclavicular joint osteoarthritis, with high-riding humeral head suggesting underlying rotator cuff pathology.   Electronically Signed   By: Vinnie Langton M.D.   On: 02/03/2015 11:05    Cardiac Studies:  Assessment/Plan:  Degenerative joint disease right shoulder rule out rotator cuff injury UTI Nonischemic dilated cardiomyopathy Compensated systolic congestive heart failure Chronic atrial fibrillation with moderate ventricular response Peripheral vascular disease status post right AKA History of tobacco abuse Left iliac thrombectomy in the past Mild dementia Chronic kidney disease stage III Plan   continue present management OT PT consult Social service consult for skilled nursing facility   Charolette Forward 02/04/2015, 11:27 AM

## 2015-02-05 LAB — URINE CULTURE

## 2015-02-05 LAB — PROTIME-INR
INR: 2.65 — ABNORMAL HIGH (ref 0.00–1.49)
Prothrombin Time: 27.9 seconds — ABNORMAL HIGH (ref 11.6–15.2)

## 2015-02-05 MED ORDER — WARFARIN SODIUM 4 MG PO TABS
4.0000 mg | ORAL_TABLET | Freq: Every day | ORAL | Status: DC
  Administered 2015-02-05: 4 mg via ORAL
  Filled 2015-02-05: qty 1

## 2015-02-05 MED ORDER — ACETAMINOPHEN 500 MG PO TABS
500.0000 mg | ORAL_TABLET | Freq: Three times a day (TID) | ORAL | Status: DC | PRN
  Administered 2015-02-05: 500 mg via ORAL
  Filled 2015-02-05: qty 1

## 2015-02-05 NOTE — Progress Notes (Signed)
Pt c/o dull headache this morning, pt states it is worse when he moves his head and states tylenol may help. Dr. Terrence Dupont notified and new order for tylenol given.

## 2015-02-05 NOTE — Care Management Note (Addendum)
Case Management Note  Patient Details  Name: Cristian Miller MRN: [id number] Date of Birth: 04/28/1931  Subjective/Objective:        Admitted with UTI            Action/Plan: CM talked to patient about DCP; patient has a caregiver at home that assist in his care 7 days per week per patient. He refused SNF placement and is requesting to return home at discharge. HHC choice offered, patient chose [facility]. Marie with [facility] called for arrangements. Dr Terrence Dupont called and updated. Patient is to be discharged home tomorrow.                  Expected Discharge Plan:  [place]  In-House Referral:  Clinical Social Work  Discharge planning Services  CM Consult  HH Arranged:  RN, PT, OT [facility] Agency:  [facility]  Status of Service:  In process, will continue to follow  Royston Bake, RN 02/05/2015, 3:26 PM

## 2015-02-05 NOTE — Progress Notes (Signed)
Subjective:  Patient denies any chest pain or shortness of breath.  Denies further headache.  States shoulder pain is improved. Objective:  Vital Signs in the last 24 hours: Temp:  [97.5 F (36.4 C)-98.2 F (36.8 C)] 98.2 F (36.8 C) (09/07 1020) Pulse Rate:  [70-80] 80 (09/07 1020) Resp:  [18-20] 20 (09/07 1020) BP: (97-149)/(60-90) 130/78 mmHg (09/07 1020) SpO2:  [94 %-100 %] 94 % (09/07 1020) Weight:  [72.2 kg (159 lb 2.8 oz)] 72.2 kg (159 lb 2.8 oz) (09/07 0619)  Intake/Output from previous day: 09/06 0701 - 09/07 0700 In: 2344.2 [P.O.:1080; I.V.:1264.2] Out: 751 [Urine:751] Intake/Output from this shift:    Physical Exam: Neck: no adenopathy, no carotid bruit, no JVD and supple, symmetrical, trachea midline Lungs: clear to auscultation bilaterally Heart: irregularly irregular rhythm, S1, S2 normal and soft systolic murmur noted Abdomen: soft, non-tender; bowel sounds normal; no masses,  no organomegaly Extremities: right above-the-knee amputation noted  Lab Results:  Recent Labs  02/03/15 2230 02/04/15 0319  WBC 11.4* 9.6  HGB 15.4 15.7  PLT 176 194    Recent Labs  02/03/15 2230 02/04/15 0319  NA 139 141  K 4.2 4.5  CL 109 108  CO2 21* 25  GLUCOSE 160* 125*  BUN 28* 28*  CREATININE 1.42* 1.50*   No results for input(s): TROPONINI in the last 72 hours.  Invalid input(s): CK, MB Hepatic Function Panel  Recent Labs  02/03/15 2230  PROT 7.2  ALBUMIN 3.0*  AST 19  ALT 18  ALKPHOS 73  BILITOT 0.5   No results for input(s): CHOL in the last 72 hours. No results for input(s): PROTIME in the last 72 hours.  Imaging: Imaging results have been reviewed and No results found.  Cardiac Studies:  Assessment/Plan:  Degenerative joint disease right shoulder rule out rotator cuff injury UTI Nonischemic dilated cardiomyopathy Compensated systolic congestive heart failure Chronic atrial fibrillation with moderate ventricular response Peripheral  vascular disease status post right AKA History of tobacco abuse Left iliac thrombectomy in the past Mild dementia Chronic kidney disease stage III Plan  continue present management. Awaiting skilled nursing facility   Cristian Miller 02/05/2015, 11:50 AM

## 2015-02-05 NOTE — Evaluation (Signed)
Occupational Therapy Evaluation Patient Details Name: Cristian Miller MRN: [id number] DOB: [birth year] Today's Date: 02/05/2015    History of Present Illness Pt admitted with UTI and rt shoulder injury (?rotator cuff injury). PMH - CHF, Rt AKA, PVD, A-fib, CKD   Clinical Impression   Pt admitted with above. Pt getting assist with ADLs, PTA. Feel pt will benefit from acute OT to increase independence prior to d/c. Recommending SNF for rehab, but pt does not seem agreeable to this.     Follow Up Recommendations  SNF;Supervision/Assistance - 24 hour    Equipment Recommendations  Other (comment) (TBD)    Recommendations for Other Services       Precautions / Restrictions Precautions Precautions: Fall Restrictions Weight Bearing Restrictions: No      Mobility Bed Mobility Overal bed mobility: Needs Assistance Bed Mobility: Supine to Sit;Sit to Supine     Supine to sit: Max assist Sit to supine: Max assist   General bed mobility comments: cues for technique. Assist with hips and with trunk to come to sitting position.  Transfers                 General transfer comment: unable with +1 assist    Balance Overall balance assessment: Needs assistance Sitting-balance support: Bilateral upper extremity supported (left foot supported) Sitting balance-Leahy Scale: Zero                                      ADL Overall ADL's : Needs assistance/impaired     Grooming: Wash/dry face;Bed level;Set up;Supervision/safety   Upper Body Bathing: Set up;Supervision/ safety;Bed level   Lower Body Bathing: Maximal assistance;Bed level       Lower Body Dressing: Total assistance;Bed level;Maximal assistance                 General ADL Comments: Pt sat EOB with heavy assist for balance.      Vision     Perception     Praxis      Pertinent Vitals/Pain Pain Assessment: 0-10 Pain Score: 3  Pain Location: left wrist and right shoulder Pain  Intervention(s): Monitored during session     Hand Dominance     Extremity/Trunk Assessment Upper Extremity Assessment Upper Extremity Assessment: Generalized weakness (Rt shoulder flexion weaker than left)   Lower Extremity Assessment Lower Extremity Assessment: Defer to PT evaluation       Communication Communication Communication: No difficulties   Cognition Arousal/Alertness: Awake/alert Behavior During Therapy: WFL for tasks assessed/performed Overall Cognitive Status: No family/caregiver present to determine baseline cognitive functioning (dementia according to chart)                     General Comments       Exercises       Shoulder Instructions      Home Living Family/patient expects to be discharged to:: Private residence Living Arrangements: Alone Available Help at Discharge: Personal care attendant Type of Home: House Home Access: Ramped entrance     Home Layout: One level     Bathroom Shower/Tub: Tub/shower unit         Home Equipment: Wheelchair - manual;Shower seat;Bedside commode          Prior Functioning/Environment Level of Independence: Needs assistance  Gait / Transfers Assistance Needed: Modified Independent with transfer to w/c until last few weeks. ADL's / Homemaking Assistance Needed: reports assist with dressing and bathing  and toilet hygiene at times        OT Diagnosis: Generalized weakness;Acute pain   OT Problem List: Decreased strength;Pain;Decreased cognition;Decreased knowledge of use of DME or AE;Decreased knowledge of precautions;Impaired balance (sitting and/or standing)   OT Treatment/Interventions: Self-care/ADL training;Therapeutic exercise;DME and/or AE instruction;Therapeutic activities;Cognitive remediation/compensation;Balance training;Patient/family education    OT Goals(Current goals can be found in the care plan section) Acute Rehab OT Goals Patient Stated Goal: go home OT Goal Formulation: With  patient Time For Goal Achievement: 02/12/15 Potential to Achieve Goals: Fair ADL Goals Pt Will Perform Grooming: with min assist;sitting (sitting unsupported) Pt Will Transfer to Toilet: with mod assist;squat pivot transfer (drop arm commode) Pt Will Perform Toileting - Clothing Manipulation and hygiene: with mod assist;sitting/lateral leans Additional ADL Goal #1: Pt will perform bed mobility at Min assist level as precursor for ADLs.  OT Frequency: Min 2X/week   Barriers to D/C:            Co-evaluation              End of Session Nurse Communication: Mobility status;Other (comment) (may need lift to get out of bed)  Activity Tolerance: Patient tolerated treatment well Patient left: in bed;with call bell/phone within reach;with bed alarm set;with nursing/sitter in room   Time: [phone] (some of this time waiting for pt to get off the phone) OT Time Calculation (min): 21 min Charges:  OT General Charges $OT Visit: 1 Procedure OT Evaluation $Initial OT Evaluation Tier I: 1 Procedure G-Codes: OT G-codes **NOT FOR INPATIENT CLASS** Functional Assessment Tool Used: clinical judgment Functional Limitation: Self care Self Care Current Status (J9417): At least 40 percent but less than 60 percent impaired, limited or restricted Self Care Goal Status (E0814): At least 20 percent but less than 40 percent impaired, limited or restricted  Benito Mccreedy OTR/L [phone] 02/05/2015, 3:35 PM

## 2015-02-06 LAB — PROTIME-INR
INR: 3.02 — ABNORMAL HIGH (ref 0.00–1.49)
PROTHROMBIN TIME: 30.8 s — AB (ref 11.6–15.2)

## 2015-02-06 MED ORDER — RAMIPRIL 5 MG PO CAPS: 5.0000 mg | ORAL_CAPSULE | Freq: Every day | ORAL | Status: AC

## 2015-02-06 MED ORDER — WARFARIN SODIUM 3 MG PO TABS: 3.0000 mg | ORAL_TABLET | Freq: Every day | ORAL | Status: DC

## 2015-02-06 MED ORDER — WARFARIN SODIUM 3 MG PO TABS: 3.0000 mg | ORAL_TABLET | Freq: Every day | ORAL | Status: AC

## 2015-02-06 NOTE — Discharge Summary (Signed)
Discharge summary dictated on 02/06/2015 dictation number is [phone]

## 2015-02-06 NOTE — Discharge Instructions (Signed)
A Urinary Tract Infection Urinary tract infections (UTIs) can develop anywhere along your urinary tract. Your urinary tract is your body's drainage system for removing wastes and extra water. Your urinary tract includes two kidneys, two ureters, a bladder, and a urethra. Your kidneys are a pair of bean-shaped organs. Each kidney is about the size of your fist. They are located below your ribs, one on each side of your spine. CAUSES Infections are caused by microbes, which are microscopic organisms, including fungi, viruses, and bacteria. These organisms are so small that they can only be seen through a microscope. Bacteria are the microbes that most commonly cause UTIs. SYMPTOMS  Symptoms of UTIs may vary by age and gender of the patient and by the location of the infection. Symptoms in young women typically include a frequent and intense urge to urinate and a painful, burning feeling in the bladder or urethra during urination. Older women and men are more likely to be tired, shaky, and weak and have muscle aches and abdominal pain. A fever may mean the infection is in your kidneys. Other symptoms of a kidney infection include pain in your back or sides below the ribs, nausea, and vomiting. DIAGNOSIS To diagnose a UTI, your caregiver will ask you about your symptoms. Your caregiver also will ask to provide a urine sample. The urine sample will be tested for bacteria and white blood cells. White blood cells are made by your body to help fight infection. TREATMENT  Typically, UTIs can be treated with medication. Because most UTIs are caused by a bacterial infection, they usually can be treated with the use of antibiotics. The choice of antibiotic and length of treatment depend on your symptoms and the type of bacteria causing your infection. HOME CARE INSTRUCTIONS  If you were prescribed antibiotics, take them exactly as your caregiver instructs you. Finish the medication even if you feel better after you  have only taken some of the medication.  Drink enough water and fluids to keep your urine clear or pale yellow.  Avoid caffeine, tea, and carbonated beverages. They tend to irritate your bladder.  Empty your bladder often. Avoid holding urine for long periods of time.  Empty your bladder before and after sexual intercourse.  After a bowel movement, women should cleanse from front to back. Use each tissue only once. SEEK MEDICAL CARE IF:   You have back pain.  You develop a fever.  Your symptoms do not begin to resolve within 3 days. SEEK IMMEDIATE MEDICAL CARE IF:   You have severe back pain or lower abdominal pain.  You develop chills.  You have nausea or vomiting.  You have continued burning or discomfort with urination. MAKE SURE YOU:   Understand these instructions.  Will watch your condition.  Will get help right away if you are not doing well or get worse. Document Released: 02/24/2005 Document Revised: 11/16/2011 Document Reviewed: 06/25/2011 [facility] Patient Information 2015 [place], [place]. This information is not intended to replace advice given to you by your health care provider. Make sure you discuss any questions you have with your health care provider.

## 2015-02-06 NOTE — Progress Notes (Signed)
PT Note Late G Code Entry    02/04/15 1559  PT G-Codes **NOT FOR INPATIENT CLASS**  Functional Assessment Tool Used clinical judgement  Functional Limitation Mobility: Walking and moving around  Mobility: Walking and Moving Around Current Status (V9166) CL  Mobility: Walking and Moving Around Goal Status [phone]) CI  [facility] PT [phone]

## 2015-02-06 NOTE — Progress Notes (Signed)
Pt discharged to home. Discharge instructions completed with patient and caregiver. Iv discontinued.

## 2015-02-07 NOTE — Discharge Summary (Signed)
Cristian Miller, Cristian Miller NO.:  [id number]  MEDICAL RECORD NO.:  [id number]  LOCATION:  3E29C                        FACILITY:  [facility]  PHYSICIAN:  Allegra Lai. Terrence Dupont, M.D. DATE OF BIRTH:  20-Jun-1930  DATE OF ADMISSION:  02/03/2015 DATE OF DISCHARGE:  02/06/2015                              DISCHARGE SUMMARY   ADMITTING DIAGNOSES: 1. Degenerative joint disease of right shoulder, rule out rotator cuff     injury. 2. Urinary tract infection. 3. Nonischemic dilated cardiomyopathy. 4. Compensated systolic congestive heart failure. 5. Chronic atrial fibrillation with moderate ventricular response. 6. Peripheral vascular disease, status post right above-knee     amputation. 7. History of tobacco abuse. 8. Left iliac thrombectomy in the past. 9. Mild dementia. 10.Chronic kidney disease, stage III.  FINAL DIAGNOSES: 1. Status post urinary tract infection. 2. Degenerative joint disease, right shoulder. 3. Nonischemic dilated cardiomyopathy. 4. Compensated systolic congestive heart failure. 5. Chronic atrial fibrillation. 6. Peripheral vascular disease, status post right above-knee     amputation in the past. 7. History of tobacco abuse. 8. Left iliac thrombectomy in the past. 9. Mild dementia. 10.Chronic kidney disease, stage III, stable.  DISCHARGE HOME MEDICATION LIST: 1. Amiodarone 200 mg one tablet daily. 2. Atorvastatin 20 mg daily. 3. Carvedilol 6.25 mg one tablet twice daily. 4. Nitrostat sublingual p.r.n. 5. Omeprazole 20 mg daily. 6. Ramipril has been increased to 5 mg daily. 7. Warfarin has reduced to 3 mg daily.  The patient was advised to     stop spironolactone.  DIET:  Low salt, low cholesterol as tolerated.  ACTIVITY:  As tolerated.  CONDITION AT DISCHARGE:  Stable.  FOLLOWUP:  Follow up with me in 1 week.  BRIEF HISTORY AND HOSPITAL COURSE:  [name] is 81 year old male with past medical history significant for nonischemic dilated  cardiomyopathy, EF 30-35%; history of congestive heart failure secondary to depressed LV systolic function; chronic atrial fibrillation, on chronic anticoagulation, CHADS score of 4; peripheral vascular disease, status post right AKA in the past; chronic kidney disease, stage III; history of tobacco abuse; history of left iliac thrombectomy in the past.  He came to the ED complaining of right shoulder pain associated with generalized weakness, fatigue, then inability to get out of the bed and inability of the caregiver to provide care.  The patient was noted to have UTI, but denies any urinary complaints.  Denies any fever or chills.  Denies chest pain or shortness of breath.  Denies palpitation, lightheadedness, or syncope.  PAST MEDICAL HISTORY:  As above.  PHYSICAL EXAMINATION:  VITAL SIGNS:  Blood pressure was 125/75, pulse 58, he was afebrile. HEENT:  Conjunctiva was pink. NECK:  Supple.  No JVD.  No bruit. LUNGS:  Clear to auscultation without rhonchi or rales. CARDIOVASCULAR:  Irregularly irregular, tachycardic.  There was soft systolic murmur noted. ABDOMEN:  Soft.  Bowel sounds were present.  Nontender. EXTREMITIES:  There was no clubbing, cyanosis, or edema.  Left leg and right leg, above-knee amputation was noted.  LABORATORY DATA:  Sodium was 140, potassium 4.9, BUN 23, creatinine 1.56.  Hemoglobin was 16.6, hematocrit 50.1, white count of 10.5. Today's lab; sodium is 141, potassium 4.5,  BUN 28, creatinine 1.5. Hemoglobin is 15.7, hematocrit 47.4, white count of 9.6.  His PT is 30.8, INR 3.02.  TSH was 1.97.  Urine culture showed multiple species. Urinalysis; urine was turbid, many bacteria, large leukocytes, nitrites were negative.  The patient had CT of the brain done in the ED, which showed no evidence of acute intracranial abnormality, moderately advanced cerebral atrophy.  Moderately advanced chronic microvascular ischemic white matter disease, bilateral basal  lacunar infarcts, sequelae of remote left anterior MCA territory infarct.  BRIEF HOSPITAL COURSE:  The patient was admitted to telemetry unit.  The patient was started on IV Rocephin with improvement in his symptoms. OT/PT consultation was obtained.  Discussed with the patient regarding skilled nursing facility.  The patient refused for skilled nursing facility and wanted to be discharged home with home health aide and RN. The patient states he has help at home.  His caregiver comes every day. The patient will be discharged back home today and will be followed up in my office in 1 week.     Allegra Lai. Terrence Dupont, M.D.     MNH/MEDQ  D:  02/06/2015  T:  02/07/2015  Job:  [id number]

## 2015-04-17 ENCOUNTER — Ambulatory Visit
Admission: RE | Admit: 2015-04-17 | Discharge: 2015-04-17 | Disposition: A | Discharge status: Home / Self Care | Payer: Medicare HMO | Source: Ambulatory Visit | Attending: Internal Medicine | Admitting: Internal Medicine

## 2015-04-17 ENCOUNTER — Other Ambulatory Visit: Payer: Self-pay | Admitting: Internal Medicine

## 2015-04-17 DIAGNOSIS — M542 Cervicalgia: Secondary | ICD-10-CM

## 2015-09-07 ENCOUNTER — Encounter (HOSPITAL_COMMUNITY): Payer: Self-pay | Admitting: Nurse Practitioner

## 2015-09-07 ENCOUNTER — Emergency Department (HOSPITAL_COMMUNITY): Payer: Medicare HMO

## 2015-09-07 ENCOUNTER — Emergency Department (HOSPITAL_COMMUNITY)
Admission: EM | Admit: 2015-09-07 | Discharge: 2015-09-08 | Disposition: A | Discharge status: Home / Self Care | Payer: Medicare HMO | Attending: Emergency Medicine | Admitting: Emergency Medicine

## 2015-09-07 DIAGNOSIS — Z8669 Personal history of other diseases of the nervous system and sense organs: Secondary | ICD-10-CM | POA: Insufficient documentation

## 2015-09-07 DIAGNOSIS — Z7901 Long term (current) use of anticoagulants: Secondary | ICD-10-CM | POA: Diagnosis not present

## 2015-09-07 DIAGNOSIS — Z87438 Personal history of other diseases of male genital organs: Secondary | ICD-10-CM | POA: Insufficient documentation

## 2015-09-07 DIAGNOSIS — Z862 Personal history of diseases of the blood and blood-forming organs and certain disorders involving the immune mechanism: Secondary | ICD-10-CM | POA: Diagnosis not present

## 2015-09-07 DIAGNOSIS — Z79899 Other long term (current) drug therapy: Secondary | ICD-10-CM | POA: Diagnosis not present

## 2015-09-07 DIAGNOSIS — Z87891 Personal history of nicotine dependence: Secondary | ICD-10-CM | POA: Insufficient documentation

## 2015-09-07 DIAGNOSIS — R300 Dysuria: Secondary | ICD-10-CM | POA: Diagnosis present

## 2015-09-07 DIAGNOSIS — Z86718 Personal history of other venous thrombosis and embolism: Secondary | ICD-10-CM | POA: Insufficient documentation

## 2015-09-07 DIAGNOSIS — N390 Urinary tract infection, site not specified: Secondary | ICD-10-CM | POA: Diagnosis not present

## 2015-09-07 DIAGNOSIS — I509 Heart failure, unspecified: Secondary | ICD-10-CM | POA: Insufficient documentation

## 2015-09-07 DIAGNOSIS — Z8546 Personal history of malignant neoplasm of prostate: Secondary | ICD-10-CM | POA: Diagnosis not present

## 2015-09-07 DIAGNOSIS — I10 Essential (primary) hypertension: Secondary | ICD-10-CM | POA: Insufficient documentation

## 2015-09-07 HISTORY — DX: Benign prostatic hyperplasia without lower urinary tract symptoms: N40.0

## 2015-09-07 HISTORY — DX: Urinary tract infection, site not specified: N39.0

## 2015-09-07 LAB — CBC WITH DIFFERENTIAL/PLATELET
BASOS ABS: 0 10*3/uL (ref 0.0–0.1)
Basophils Relative: 0 %
Eosinophils Absolute: 0.1 10*3/uL (ref 0.0–0.7)
Eosinophils Relative: 1 %
HEMATOCRIT: 42.6 % (ref 39.0–52.0)
Hemoglobin: 13.8 g/dL (ref 13.0–17.0)
LYMPHS PCT: 17 %
Lymphs Abs: 1.8 10*3/uL (ref 0.7–4.0)
MCH: 29.1 pg (ref 26.0–34.0)
MCHC: 32.4 g/dL (ref 30.0–36.0)
MCV: 89.9 fL (ref 78.0–100.0)
MONO ABS: 1.4 10*3/uL — AB (ref 0.1–1.0)
Monocytes Relative: 13 %
NEUTROS ABS: 7.2 10*3/uL (ref 1.7–7.7)
Neutrophils Relative %: 69 %
Platelets: 241 10*3/uL (ref 150–400)
RBC: 4.74 MIL/uL (ref 4.22–5.81)
RDW: 16.4 % — AB (ref 11.5–15.5)
WBC: 10.5 10*3/uL (ref 4.0–10.5)

## 2015-09-07 LAB — BASIC METABOLIC PANEL
ANION GAP: 8 (ref 5–15)
BUN: 20 mg/dL (ref 6–20)
CHLORIDE: 107 mmol/L (ref 101–111)
CO2: 25 mmol/L (ref 22–32)
Calcium: 9.3 mg/dL (ref 8.9–10.3)
Creatinine, Ser: 1.63 mg/dL — ABNORMAL HIGH (ref 0.61–1.24)
GFR calc Af Amer: 44 mL/min — ABNORMAL LOW (ref >60)
GFR, EST NON AFRICAN AMERICAN: 38 mL/min — AB (ref >60)
GLUCOSE: 170 mg/dL — AB (ref 65–99)
POTASSIUM: 4.1 mmol/L (ref 3.5–5.1)
Sodium: 140 mmol/L (ref 135–145)

## 2015-09-07 NOTE — ED Notes (Signed)
Patient's caregiver notified that pt is up for d/c - she is on the way to pick him up.

## 2015-09-07 NOTE — ED Provider Notes (Signed)
CSN: [id number]     Arrival date & time 09/07/15  1841 History   First MD Initiated Contact with Patient 09/07/15 2000     Chief Complaint  Patient presents with  . Dysuria     (Consider location/radiation/quality/duration/timing/severity/associated sxs/prior Treatment) HPI.Marland KitchenMarland KitchenMarland KitchenDysuria, frequency, decreased urinary stream for unknown length of time. No fever, sweats, chills, flank pain. Severity symptoms is moderate. Nothing makes symptoms better or worse.  Past Medical History  Diagnosis Date  . Hypertension   . Atrial fibrillation ([place])   . Cataract   . Peripheral vascular disease ([place])   . DVT (deep venous thrombosis) ([place])   . CHF (congestive heart failure) ([place])   . [facility])     prostate CA  . Leukocytosis, unspecified 04/21/2013  . UTI (urinary tract infection)   . Prostate enlargement    Past Surgical History  Procedure Laterality Date  . Leg amputation    . Cataract extraction    . Pr vein bypass graft,aorto-fem-pop      2009  right axillary bifemoral bypass and bilateral iliofemoral thrombectomies  . Abdominal aortagram N/A 03/15/2012    Procedure: ABDOMINAL Maxcine Ham;  Surgeon: Elam Dutch, MD;  Location: [facility] CATH LAB;  Service: Cardiovascular;  Laterality: N/A;   History reviewed. No pertinent family history. Social History  Substance Use Topics  . Smoking status: Former Smoker    Quit date: 06/01/1991  . Smokeless tobacco: Never Used  . Alcohol Use: No    Review of Systems  All other systems reviewed and are negative.     Allergies  Review of patient's allergies indicates no known allergies.  Home Medications   Prior to Admission medications   Medication Sig Start Date End Date Taking? Authorizing Provider  amiodarone (PACERONE) 200 MG tablet Take 1 tablet (200 mg total) by mouth daily. 04/23/13   Charolette Forward, MD  atorvastatin (LIPITOR) 20 MG tablet Take 1 tablet (20 mg total) by mouth daily at 6 PM. 04/23/13   Charolette Forward, MD   carvedilol (COREG) 6.25 MG tablet Take 1 tablet (6.25 mg total) by mouth 2 (two) times daily with a meal. 04/23/13   Charolette Forward, MD  cephALEXin (KEFLEX) 500 MG capsule Take 1 capsule (500 mg total) by mouth 3 (three) times daily. 09/08/15   Nat Christen, MD  nitroGLYCERIN (NITROSTAT) 0.4 MG SL tablet Place 1 tablet (0.4 mg total) under the tongue every 5 (five) minutes x 3 doses as needed for chest pain. 04/23/13   Charolette Forward, MD  omeprazole (PRILOSEC) 20 MG capsule Take 20 mg by mouth daily.    Historical Provider, MD  ramipril (ALTACE) 5 MG capsule Take 1 capsule (5 mg total) by mouth daily. 02/06/15   Charolette Forward, MD  warfarin (COUMADIN) 3 MG tablet Take 1 tablet (3 mg total) by mouth daily at 6 PM. 02/06/15   Charolette Forward, MD   BP 165/116 mmHg  Pulse 85  Temp(Src) 98.5 F (36.9 C) (Oral)  Resp 18  Ht 5\' 11"  (1.803 m)  Wt 170 lb (77.111 kg)  BMI 23.72 kg/m2  SpO2 99% Physical Exam  Constitutional: He is oriented to person, place, and time. He appears well-developed and well-nourished.  HENT:  Head: Normocephalic and atraumatic.  Eyes: Conjunctivae and EOM are normal. Pupils are equal, round, and reactive to light.  Neck: Normal range of motion. Neck supple.  Cardiovascular: Normal rate and regular rhythm.   Pulmonary/Chest: Effort normal and breath sounds normal.  Abdominal: Soft. Bowel sounds are normal.  Genitourinary:  No CVA tenderness  Musculoskeletal: Normal range of motion.  Neurological: He is alert and oriented to person, place, and time.  Skin: Skin is warm and dry.  Psychiatric: He has a normal mood and affect. His behavior is normal.  Nursing note and vitals reviewed.   ED Course  Procedures (including critical care time) Labs Review Labs Reviewed  URINALYSIS, ROUTINE W REFLEX MICROSCOPIC (NOT AT [facility]) - Abnormal; Notable for the following:    APPearance TURBID (*)    pH 8.5 (*)    Hgb urine dipstick SMALL (*)    Protein, ur 100 (*)    Nitrite POSITIVE  (*)    Leukocytes, UA LARGE (*)    All other components within normal limits  BASIC METABOLIC PANEL - Abnormal; Notable for the following:    Glucose, Bld 170 (*)    Creatinine, Ser 1.63 (*)    GFR calc non Af Amer 38 (*)    GFR calc Af Amer 44 (*)    All other components within normal limits  CBC WITH DIFFERENTIAL/PLATELET - Abnormal; Notable for the following:    RDW 16.4 (*)    Monocytes Absolute 1.4 (*)    All other components within normal limits  URINE MICROSCOPIC-ADD ON - Abnormal; Notable for the following:    Squamous Epithelial / LPF 0-5 (*)    Bacteria, UA MANY (*)    Crystals TRIPLE PHOSPHATE CRYSTALS (*)    All other components within normal limits  URINE CULTURE    Imaging Review US Renal  09/07/2015  CLINICAL DATA:  Urinary retention EXAM: RENAL / URINARY TRACT ULTRASOUND COMPLETE COMPARISON:  04/18/2013 FINDINGS: Right Kidney: Length: 13.2 cm. Multiple cysts are noted within the right kidney. The largest of these measures 7.1 cm. Left Kidney: Length: 11.9 cm. Multiple cysts are noted within the left kidney. Largest of these measures 8.2 cm. Bladder: Appears normal for degree of bladder distention. IMPRESSION: Bilateral renal cysts.  These are stable from the prior exam. Electronically Signed   By: Inez Catalina M.D.   On: 09/07/2015 21:35   I have personally reviewed and evaluated these images and lab results as part of my medical decision-making.   EKG Interpretation None      MDM   Final diagnoses:  Dysuria  UTI (lower urinary tract infection)    Patient is not septic. Urinalysis shows evidence of infection. Urine culture. Rx Keflex 500 mg TID secondary to renal function. Follow-up with urology.    Nat Christen, MD 09/08/15 0040

## 2015-09-07 NOTE — ED Notes (Signed)
Patient states he is unable to urinate at this time - given a urinal and cup of ice water per MD.

## 2015-09-07 NOTE — ED Notes (Signed)
Pt states he can not void right now

## 2015-09-07 NOTE — ED Notes (Signed)
Patient urinated in bed after trying to go in urinal - states he did not know that he went in the bed. Placed condom cath on patient and changed linen and gown. MD aware.

## 2015-09-07 NOTE — ED Notes (Signed)
Patient's caretaker: Lorelee Market to be called when pt is up for D/C [phone]

## 2015-09-07 NOTE — ED Notes (Signed)
He c/o 1 week history dysuria. He denies fevers, n/v, abd pain. He has a history of UTI and enlarged prostate.

## 2015-09-08 LAB — URINE MICROSCOPIC-ADD ON

## 2015-09-08 LAB — URINALYSIS, ROUTINE W REFLEX MICROSCOPIC
Bilirubin Urine: NEGATIVE
GLUCOSE, UA: NEGATIVE mg/dL
Ketones, ur: NEGATIVE mg/dL
Nitrite: POSITIVE — AB
Protein, ur: 100 mg/dL — AB
SPECIFIC GRAVITY, URINE: 1.017 (ref 1.005–1.030)
pH: 8.5 — ABNORMAL HIGH (ref 5.0–8.0)

## 2015-09-08 MED ORDER — CIPROFLOXACIN HCL 500 MG PO TABS: 500.0000 mg | ORAL_TABLET | Freq: Once | ORAL | Status: DC

## 2015-09-08 MED ORDER — CEPHALEXIN 250 MG PO CAPS
500.0000 mg | ORAL_CAPSULE | Freq: Once | ORAL | Status: AC
  Administered 2015-09-08: 500 mg via ORAL
  Filled 2015-09-08: qty 2

## 2015-09-08 MED ORDER — CEPHALEXIN 500 MG PO CAPS: 500.0000 mg | ORAL_CAPSULE | Freq: Three times a day (TID) | ORAL | Status: DC

## 2015-09-08 NOTE — ED Notes (Signed)
Patient cleaned up and full linen changed again. Pt's caretaker is on the way to pick pt up.

## 2015-09-08 NOTE — Discharge Instructions (Signed)
You have a urinary tract infection. Increase fluids. Prescription for antibiotic. Recommend follow-up with a urologist. Phone number given.

## 2015-09-10 LAB — URINE CULTURE

## 2015-09-11 ENCOUNTER — Telehealth (HOSPITAL_BASED_OUTPATIENT_CLINIC_OR_DEPARTMENT_OTHER): Payer: Self-pay | Admitting: Emergency Medicine

## 2015-09-11 NOTE — Telephone Encounter (Signed)
Post ED Visit - Positive Culture Follow-up  Culture report reviewed by antimicrobial stewardship pharmacist:  []  Elenor Quinones, Pharm.D. []  Heide Guile, Pharm.D., BCPS []  Parks Neptune, Pharm.D. []  Alycia Rossetti, Pharm.D., BCPS []  [place], Pharm.D., BCPS, AAHIVP []  Legrand Como, Pharm.D., BCPS, AAHIVP []  Milus Glazier, Pharm.D. []  Stephens November, Pharm.Leonette Nutting PharmD  Positive urine  culture Treated with cephalexin, organism sensitive to the same and no further patient follow-up is required at this time.  Hazle Nordmann 09/11/2015, 9:46 AM

## 2016-08-12 ENCOUNTER — Encounter (HOSPITAL_COMMUNITY): Payer: Self-pay

## 2016-08-12 ENCOUNTER — Emergency Department (HOSPITAL_COMMUNITY): Payer: Medicare HMO

## 2016-08-12 ENCOUNTER — Emergency Department (HOSPITAL_COMMUNITY)
Admission: EM | Admit: 2016-08-12 | Discharge: 2016-08-12 | Disposition: A | Discharge status: Home / Self Care | Payer: Medicare HMO | Attending: Emergency Medicine | Admitting: Emergency Medicine

## 2016-08-12 DIAGNOSIS — Z87891 Personal history of nicotine dependence: Secondary | ICD-10-CM | POA: Insufficient documentation

## 2016-08-12 DIAGNOSIS — Y92009 Unspecified place in unspecified non-institutional (private) residence as the place of occurrence of the external cause: Secondary | ICD-10-CM | POA: Insufficient documentation

## 2016-08-12 DIAGNOSIS — I110 Hypertensive heart disease with heart failure: Secondary | ICD-10-CM | POA: Insufficient documentation

## 2016-08-12 DIAGNOSIS — M25551 Pain in right hip: Secondary | ICD-10-CM | POA: Insufficient documentation

## 2016-08-12 DIAGNOSIS — I509 Heart failure, unspecified: Secondary | ICD-10-CM | POA: Diagnosis not present

## 2016-08-12 DIAGNOSIS — W050XXA Fall from non-moving wheelchair, initial encounter: Secondary | ICD-10-CM | POA: Insufficient documentation

## 2016-08-12 DIAGNOSIS — W19XXXA Unspecified fall, initial encounter: Secondary | ICD-10-CM

## 2016-08-12 DIAGNOSIS — Z8546 Personal history of malignant neoplasm of prostate: Secondary | ICD-10-CM | POA: Insufficient documentation

## 2016-08-12 DIAGNOSIS — R930 Abnormal findings on diagnostic imaging of skull and head, not elsewhere classified: Secondary | ICD-10-CM | POA: Insufficient documentation

## 2016-08-12 DIAGNOSIS — Y939 Activity, unspecified: Secondary | ICD-10-CM | POA: Diagnosis not present

## 2016-08-12 DIAGNOSIS — Z7901 Long term (current) use of anticoagulants: Secondary | ICD-10-CM | POA: Diagnosis not present

## 2016-08-12 DIAGNOSIS — Y999 Unspecified external cause status: Secondary | ICD-10-CM | POA: Diagnosis not present

## 2016-08-12 LAB — BASIC METABOLIC PANEL
Anion gap: 10 (ref 5–15)
BUN: 16 mg/dL (ref 6–20)
CO2: 23 mmol/L (ref 22–32)
CREATININE: 1.56 mg/dL — AB (ref 0.61–1.24)
Calcium: 9.1 mg/dL (ref 8.9–10.3)
Chloride: 106 mmol/L (ref 101–111)
GFR calc Af Amer: 46 mL/min — ABNORMAL LOW (ref >60)
GFR, EST NON AFRICAN AMERICAN: 39 mL/min — AB (ref >60)
GLUCOSE: 187 mg/dL — AB (ref 65–99)
Potassium: 3.9 mmol/L (ref 3.5–5.1)
Sodium: 139 mmol/L (ref 135–145)

## 2016-08-12 LAB — CBC WITH DIFFERENTIAL/PLATELET
Basophils Absolute: 0 10*3/uL (ref 0.0–0.1)
Basophils Relative: 0 %
EOS PCT: 0 %
Eosinophils Absolute: 0 10*3/uL (ref 0.0–0.7)
HEMATOCRIT: 46.8 % (ref 39.0–52.0)
Hemoglobin: 15.4 g/dL (ref 13.0–17.0)
LYMPHS PCT: 17 %
Lymphs Abs: 1.6 10*3/uL (ref 0.7–4.0)
MCH: 29.4 pg (ref 26.0–34.0)
MCHC: 32.9 g/dL (ref 30.0–36.0)
MCV: 89.3 fL (ref 78.0–100.0)
MONO ABS: 1 10*3/uL (ref 0.1–1.0)
MONOS PCT: 11 %
NEUTROS ABS: 6.6 10*3/uL (ref 1.7–7.7)
Neutrophils Relative %: 72 %
PLATELETS: 157 10*3/uL (ref 150–400)
RBC: 5.24 MIL/uL (ref 4.22–5.81)
RDW: 16.5 % — ABNORMAL HIGH (ref 11.5–15.5)
WBC: 9.1 10*3/uL (ref 4.0–10.5)

## 2016-08-12 LAB — PROTIME-INR
INR: 2.12
Prothrombin Time: 24.1 seconds — ABNORMAL HIGH (ref 11.4–15.2)

## 2016-08-12 NOTE — ED Triage Notes (Signed)
BIB [facility] from home where pt had a fall. Pt reports to EMS he fell from his wheel chair and "didn't hit anything but the floor" Denies LOC, denies hitting head. C/O right hip painw ith movement not palpation. Hx of Right leg amputation. Pt presents with large swollen area to upper right arm. [facility].

## 2016-08-12 NOTE — ED Notes (Signed)
Family member notified of pt being picked up by [facility]

## 2016-08-12 NOTE — ED Notes (Signed)
Pt placed in paper scrubs, [facility] called.

## 2016-08-12 NOTE — ED Provider Notes (Addendum)
[facility] DEPT Provider Note   CSN: [id number] Arrival date & time: 08/12/16  1309     History   Chief Complaint Chief Complaint  Patient presents with  . Fall    HPI Cristian Miller is a 83 y.o. male.  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 Usually as a nurse's aide. Patient is wheelchair bound. Patient has a previous right above-the-knee amputation. Patient fell out of his wheelchair. Denied hitting his head. Denies any loss of consciousness. But the fall was unwitnessed. Patient's complaint is right hip pain.  Patient has a history of atrial fibrillation. Patient is on Coumadin.      Past Medical History:  Diagnosis Date  . Atrial fibrillation ([place])   . Cancer [facility])    prostate CA  . Cataract   . CHF (congestive heart failure) ([place])   . DVT (deep venous thrombosis) ([place])   . Hypertension   . Leukocytosis, unspecified 04/21/2013  . Peripheral vascular disease ([place])   . Prostate enlargement   . UTI (urinary tract infection)     Patient Active Problem List   Diagnosis Date Noted  . Urinary tract infection 02/03/2015  . Leukocytosis, unspecified 04/21/2013  . Peripheral vascular disease, unspecified 03/09/2012    Past Surgical History:  Procedure Laterality Date  . ABDOMINAL AORTAGRAM N/A 03/15/2012   Procedure: ABDOMINAL Maxcine Ham;  Surgeon: Elam Dutch, MD;  Location: [facility] CATH LAB;  Service: Cardiovascular;  Laterality: N/A;  . CATARACT EXTRACTION    . LEG AMPUTATION    . PR VEIN BYPASS GRAFT,AORTO-FEM-POP     2009  right axillary bifemoral bypass and bilateral iliofemoral thrombectomies       Home Medications    Prior to Admission medications   Medication Sig Start Date End Date Taking? Authorizing Provider  amiodarone (PACERONE) 200 MG tablet Take 1 tablet (200 mg total) by mouth daily. 04/23/13  Yes Charolette Forward, MD  atorvastatin (LIPITOR) 20 MG tablet Take 1 tablet (20 mg total) by mouth daily at 6 PM. 04/23/13  Yes Charolette Forward, MD  carvedilol  (COREG) 6.25 MG tablet Take 1 tablet (6.25 mg total) by mouth 2 (two) times daily with a meal. Patient taking differently: Take 12.5 mg by mouth 2 (two) times daily with a meal.  04/23/13  Yes Charolette Forward, MD  mirabegron ER (MYRBETRIQ) 25 MG TB24 tablet Take 25 mg by mouth daily.   Yes Historical Provider, MD  omeprazole (PRILOSEC) 20 MG capsule Take 20 mg by mouth daily.   Yes Historical Provider, MD  ramipril (ALTACE) 5 MG capsule Take 1 capsule (5 mg total) by mouth daily. Patient taking differently: Take 10 mg by mouth daily.  02/06/15  Yes Charolette Forward, MD  warfarin (COUMADIN) 4 MG tablet Take 4 mg by mouth daily.   Yes Historical Provider, MD  cephALEXin (KEFLEX) 500 MG capsule Take 1 capsule (500 mg total) by mouth 3 (three) times daily. Patient not taking: Reported on 08/12/2016 09/08/15   Nat Christen, MD  nitroGLYCERIN (NITROSTAT) 0.4 MG SL tablet Place 1 tablet (0.4 mg total) under the tongue every 5 (five) minutes x 3 doses as needed for chest pain. 04/23/13   Charolette Forward, MD  warfarin (COUMADIN) 3 MG tablet Take 1 tablet (3 mg total) by mouth daily at 6 PM. Patient not taking: Reported on 08/12/2016 02/06/15   Charolette Forward, MD    Family History No family history on file.  Social History Social History  Substance Use Topics  . Smoking status: Former Smoker  Quit date: 06/01/1991  . Smokeless tobacco: Never Used  . Alcohol use No     Allergies   Patient has no known allergies.   Review of Systems Review of Systems  Constitutional: Negative for fever.  HENT: Negative for congestion.   Eyes: Negative for visual disturbance.  Respiratory: Negative for shortness of breath.   Cardiovascular: Negative for chest pain.  Gastrointestinal: Negative for abdominal pain.  Genitourinary: Negative for dysuria.  Musculoskeletal: Negative for back pain.  Neurological: Negative for headaches.  Hematological: Bruises/bleeds easily.  Psychiatric/Behavioral: Negative for confusion.      Physical Exam Updated Vital Signs BP (!) 143/94   Pulse 82   Temp 98.1 F (36.7 C) (Oral)   Resp 16   Ht 5\' 11"  (1.803 m)   Wt 77.1 kg   SpO2 99%   BMI 23.71 kg/m   Physical Exam  Constitutional: He is oriented to person, place, and time. He appears well-developed and well-nourished. No distress.  HENT:  Head: Normocephalic and atraumatic.  Mouth/Throat: Oropharynx is clear and moist.  Eyes: EOM are normal. Pupils are equal, round, and reactive to light.  Neck: Normal range of motion. Neck supple.  No posterior tenderness to palpation to the back of the neck.  Cardiovascular: Normal rate.   Irregular  Pulmonary/Chest: Effort normal and breath sounds normal. No respiratory distress.  Abdominal: Soft. Bowel sounds are normal. There is no tenderness.  Musculoskeletal: Normal range of motion.  No deformities. Some mild tenderness to palpation the right hip. Patient with previous above-the-knee amputation.  Right arm with a large soft tissue mass suggestive of lipoma. No skin discoloration nontender. Not attached. Measuring about 6 x 4 cm.  Neurological: He is alert and oriented to person, place, and time. No cranial nerve deficit or sensory deficit. He exhibits normal muscle tone. Coordination normal.  Nursing note and vitals reviewed.    ED Treatments / Results  Labs (all labs ordered are listed, but only abnormal results are displayed) Labs Reviewed  CBC WITH DIFFERENTIAL/PLATELET - Abnormal; Notable for the following:       Result Value   RDW 16.5 (*)    All other components within normal limits  BASIC METABOLIC PANEL - Abnormal; Notable for the following:    Glucose, Bld 187 (*)    Creatinine, Ser 1.56 (*)    GFR calc non Af Amer 39 (*)    GFR calc Af Amer 46 (*)    All other components within normal limits  PROTIME-INR - Abnormal; Notable for the following:    Prothrombin Time 24.1 (*)    All other components within normal limits    EKG  EKG  Interpretation  Date/Time:  Thursday August 12 2016 13:47:49 EDT Ventricular Rate:  81 PR Interval:    QRS Duration: 125 QT Interval:  406 QTC Calculation: 472 R Axis:   -39 Text Interpretation:  Atrial fibrillation Ventricular premature complex Left bundle branch block No significant change since last tracing Confirmed by Bridget   MD, Hanzel Pizzo [phone]) on 08/12/2016 1:51:28 PM       Radiology Ct Head Wo Contrast  Result Date: 08/12/2016 CLINICAL DATA:  83 year old male with fall EXAM: CT HEAD WITHOUT CONTRAST TECHNIQUE: Contiguous axial images were obtained from the base of the skull through the vertex without intravenous contrast. COMPARISON:  02/03/2015 head CT FINDINGS: Brain: No evidence of acute infarction, hemorrhage, hydrocephalus, extra-axial collection or mass lesion/mass effect. Moderate atrophy, severe chronic small-vessel white matter ischemic changes and remote infarcts in the  left MCA territory and bilateral basal ganglia again noted. Vascular: Intracranial atherosclerotic calcifications noted. Skull: No acute abnormality Sinuses/Orbits: A small right mastoid effusion is noted. Other: None IMPRESSION: No evidence of acute intracranial abnormality. Atrophy, chronic small-vessel white matter ischemic changes and remote infarcts as described. Small right mastoid effusion. Electronically Signed   By: Margarette Canada M.D.   On: 08/12/2016 14:49   Dg Hips Bilat W Or Wo Pelvis 3-4 Views  Result Date: 08/12/2016 CLINICAL DATA:  Pain following fall EXAM: DG HIP (WITH OR WITHOUT PELVIS) 3-4V BILAT COMPARISON:  None. FINDINGS: Frontal pelvis as well as frontal and lateral oblique views of each hip-total five views -obtained. Bones are osteoporotic. There is no fracture or dislocation. There is moderate symmetric narrowing of both hip joints. No erosive change. There are surgical clips in the perineum region. There are scattered foci and pelvic arterial vascular calcification. IMPRESSION: Symmetric  narrowing both hip joints. Bones osteoporotic. No acute fracture or dislocation. There are scattered foci of pelvic arterial vascular calcification/atherosclerosis. Electronically Signed   By: Lowella Grip III M.D.   On: 08/12/2016 14:30    Procedures Procedures (including critical care time)  Medications Ordered in ED Medications - No data to display   Initial Impression / Assessment and Plan / ED Course  I have reviewed the triage vital signs and the nursing notes.  Pertinent labs & imaging results that were available during my care of the patient were reviewed by me and considered in my medical decision making (see chart for details).     Patient fell out of his wheelchair at home he was by himself. States he doesn't think he has had doesn't again loss of consciousness. There was no witnesses. Patient with complaint of right hip pain. Patient has a right above-knee amputation.  Workup for the fall without any acute findings. Head CT negative x-rays of pelvis and both hips without any acute bony injuries. Patient stable for discharge back home.   Patient states that the arm S been present for couple years. Final Clinical Impressions(s) / ED Diagnoses   Final diagnoses:  Fall, initial encounter    New Prescriptions New Prescriptions   No medications on file     Fredia Sorrow, MD 08/12/16 [place], MD 08/12/16 1625

## 2016-08-12 NOTE — ED Notes (Signed)
Spoke with pt daughter about wait for PTAR, allowed pt to speak with daughter on phone. Daughters name is Parmvir Boomer her phone number is [phone] and she would like to be notified once pt is on the way home.

## 2016-08-12 NOTE — Discharge Instructions (Signed)
Follow-up with your doctor. Return for any new or worse symptoms. Today's workup without any bony injuries. And no injury to your head.

## 2016-08-23 ENCOUNTER — Emergency Department (HOSPITAL_COMMUNITY): Payer: Medicare HMO

## 2016-08-23 ENCOUNTER — Encounter (HOSPITAL_COMMUNITY): Payer: Self-pay | Admitting: *Deleted

## 2016-08-23 ENCOUNTER — Emergency Department (HOSPITAL_COMMUNITY)
Admission: EM | Admit: 2016-08-23 | Discharge: 2016-08-23 | Disposition: A | Discharge status: Home / Self Care | Payer: Medicare HMO | Attending: Emergency Medicine | Admitting: Emergency Medicine

## 2016-08-23 DIAGNOSIS — Z87891 Personal history of nicotine dependence: Secondary | ICD-10-CM | POA: Diagnosis not present

## 2016-08-23 DIAGNOSIS — Z79899 Other long term (current) drug therapy: Secondary | ICD-10-CM | POA: Diagnosis not present

## 2016-08-23 DIAGNOSIS — Z8546 Personal history of malignant neoplasm of prostate: Secondary | ICD-10-CM | POA: Diagnosis not present

## 2016-08-23 DIAGNOSIS — R4182 Altered mental status, unspecified: Secondary | ICD-10-CM | POA: Diagnosis not present

## 2016-08-23 DIAGNOSIS — E119 Type 2 diabetes mellitus without complications: Secondary | ICD-10-CM | POA: Diagnosis not present

## 2016-08-23 DIAGNOSIS — I10 Essential (primary) hypertension: Secondary | ICD-10-CM

## 2016-08-23 DIAGNOSIS — I110 Hypertensive heart disease with heart failure: Secondary | ICD-10-CM | POA: Diagnosis present

## 2016-08-23 DIAGNOSIS — Z7901 Long term (current) use of anticoagulants: Secondary | ICD-10-CM | POA: Diagnosis not present

## 2016-08-23 DIAGNOSIS — N390 Urinary tract infection, site not specified: Secondary | ICD-10-CM

## 2016-08-23 DIAGNOSIS — I509 Heart failure, unspecified: Secondary | ICD-10-CM | POA: Diagnosis not present

## 2016-08-23 HISTORY — DX: Type 2 diabetes mellitus without complications: E11.9

## 2016-08-23 LAB — BASIC METABOLIC PANEL
Anion gap: 8 (ref 5–15)
BUN: 12 mg/dL (ref 6–20)
CO2: 27 mmol/L (ref 22–32)
Calcium: 9.2 mg/dL (ref 8.9–10.3)
Chloride: 105 mmol/L (ref 101–111)
Creatinine, Ser: 1.44 mg/dL — ABNORMAL HIGH (ref 0.61–1.24)
GFR calc Af Amer: 50 mL/min — ABNORMAL LOW (ref >60)
GFR calc non Af Amer: 43 mL/min — ABNORMAL LOW (ref >60)
GLUCOSE: 136 mg/dL — AB (ref 65–99)
POTASSIUM: 4 mmol/L (ref 3.5–5.1)
Sodium: 140 mmol/L (ref 135–145)

## 2016-08-23 LAB — I-STAT TROPONIN, ED: Troponin i, poc: 0.03 ng/mL (ref 0.00–0.08)

## 2016-08-23 LAB — URINALYSIS, ROUTINE W REFLEX MICROSCOPIC
Bilirubin Urine: NEGATIVE
GLUCOSE, UA: NEGATIVE mg/dL
KETONES UR: NEGATIVE mg/dL
Nitrite: NEGATIVE
PROTEIN: NEGATIVE mg/dL
SQUAMOUS EPITHELIAL / LPF: NONE SEEN
Specific Gravity, Urine: 1.012 (ref 1.005–1.030)
pH: 6 (ref 5.0–8.0)

## 2016-08-23 LAB — CBC WITH DIFFERENTIAL/PLATELET
Basophils Absolute: 0 10*3/uL (ref 0.0–0.1)
Basophils Relative: 0 %
Eosinophils Absolute: 0.2 10*3/uL (ref 0.0–0.7)
Eosinophils Relative: 2 %
HEMATOCRIT: 45.3 % (ref 39.0–52.0)
HEMOGLOBIN: 14.8 g/dL (ref 13.0–17.0)
LYMPHS ABS: 2.1 10*3/uL (ref 0.7–4.0)
LYMPHS PCT: 28 %
MCH: 29.6 pg (ref 26.0–34.0)
MCHC: 32.7 g/dL (ref 30.0–36.0)
MCV: 90.6 fL (ref 78.0–100.0)
MONOS PCT: 13 %
Monocytes Absolute: 1 10*3/uL (ref 0.1–1.0)
NEUTROS PCT: 57 %
Neutro Abs: 4.4 10*3/uL (ref 1.7–7.7)
Platelets: 173 10*3/uL (ref 150–400)
RBC: 5 MIL/uL (ref 4.22–5.81)
RDW: 16.7 % — ABNORMAL HIGH (ref 11.5–15.5)
WBC: 7.7 10*3/uL (ref 4.0–10.5)

## 2016-08-23 LAB — PROTIME-INR
INR: 3.41
PROTHROMBIN TIME: 35.2 s — AB (ref 11.4–15.2)

## 2016-08-23 MED ORDER — AMLODIPINE BESYLATE 5 MG PO TABS: 5.0000 mg | ORAL_TABLET | Freq: Every day | ORAL | 0 refills | Status: AC

## 2016-08-23 MED ORDER — RAMIPRIL 5 MG PO CAPS
5.0000 mg | ORAL_CAPSULE | Freq: Once | ORAL | Status: AC
  Administered 2016-08-23: 5 mg via ORAL

## 2016-08-23 MED ORDER — AMLODIPINE BESYLATE 5 MG PO TABS
5.0000 mg | ORAL_TABLET | Freq: Once | ORAL | Status: AC
  Administered 2016-08-23: 5 mg via ORAL
  Filled 2016-08-23: qty 1

## 2016-08-23 MED ORDER — RAMIPRIL 5 MG PO CAPS
5.0000 mg | ORAL_CAPSULE | Freq: Every day | ORAL | Status: DC
  Filled 2016-08-23 (×2): qty 1

## 2016-08-23 MED ORDER — CEPHALEXIN 250 MG PO CAPS
500.0000 mg | ORAL_CAPSULE | Freq: Once | ORAL | Status: AC
  Administered 2016-08-23: 500 mg via ORAL
  Filled 2016-08-23: qty 2

## 2016-08-23 MED ORDER — CEPHALEXIN 500 MG PO CAPS: 500.0000 mg | ORAL_CAPSULE | Freq: Three times a day (TID) | ORAL | 0 refills | Status: AC

## 2016-08-23 NOTE — ED Triage Notes (Signed)
Pt here with family after being sent by home healthcare RN for "Near Stroke" BP.   BP in triage is 156/96.  Pt denies headache, dizziness, blurred vision, etc.

## 2016-08-23 NOTE — Discharge Instructions (Addendum)
-  Continue taking all your medications as prescribed -Start taking the Amlodipine tomorrow morning -Follow-up with Dr. Terrence Dupont in 1-2 days

## 2016-08-23 NOTE — ED Provider Notes (Signed)
[facility] DEPT Provider Note   CSN: [id number] Arrival date & time: 08/23/16  1440  By signing my name below, I, Cristian Miller, attest that this documentation has been prepared under the direction and in the presence of Cristian Bruce, MD. Electronically Signed: Hansel Miller, ED Scribe. 08/23/16. 4:59 PM.    History   Chief Complaint Chief Complaint  Patient presents with  . Hypertension    HPI Cristian Miller is a 83 y.o. male with h/o HTN on qd Coreg who presents to the Emergency Department referred by home healthcare due to hypertension this afternoon. Per nursing note, home healthcare reported a "near stroke" BP at home; however BP at triage was 156/96. Pt states he currently feels well, but is a little tired. Pt was recently seen in the ED for a fall, but otherwise has been doing well. Pt states he was not in any pain from the fall when his BP was checked this morning. His appetite and thirst have been normal. No recent changes in medications. Daughter states the pt has been taking his BP medication consistently with no missed doses. Pt lives with daughter at home and has a nurse that cares for him. He denies CP, SOB, HA, dizziness, blurred vision, dysuria, abdominal pain, extremity numbness or weakness, additional complaints.   The history is provided by the patient and a relative. No language interpreter was used.    Past Medical History:  Diagnosis Date  . Atrial fibrillation ([place])   . Cancer [facility])    prostate CA  . Cataract   . CHF (congestive heart failure) ([place])   . Diabetes mellitus without complication ([place])   . DVT (deep venous thrombosis) ([place])   . Hypertension   . Leukocytosis, unspecified 04/21/2013  . Peripheral vascular disease ([place])   . Prostate enlargement   . UTI (urinary tract infection)     Patient Active Problem List   Diagnosis Date Noted  . Urinary tract infection 02/03/2015  . Leukocytosis, unspecified 04/21/2013  . Peripheral vascular disease,  unspecified 03/09/2012    Past Surgical History:  Procedure Laterality Date  . ABDOMINAL AORTAGRAM N/A 03/15/2012   Procedure: ABDOMINAL Maxcine Ham;  Surgeon: Elam Dutch, MD;  Location: [facility] CATH LAB;  Service: Cardiovascular;  Laterality: N/A;  . CARDIAC SURGERY    . CATARACT EXTRACTION    . LEG AMPUTATION    . PR VEIN BYPASS GRAFT,AORTO-FEM-POP     2009  right axillary bifemoral bypass and bilateral iliofemoral thrombectomies       Home Medications    Prior to Admission medications   Medication Sig Start Date End Date Taking? Authorizing Provider  amiodarone (PACERONE) 200 MG tablet Take 1 tablet (200 mg total) by mouth daily. 04/23/13   Charolette Forward, MD  amLODipine (NORVASC) 5 MG tablet Take 1 tablet (5 mg total) by mouth daily. 08/23/16   Cristian Bruce, MD  atorvastatin (LIPITOR) 20 MG tablet Take 1 tablet (20 mg total) by mouth daily at 6 PM. 04/23/13   Charolette Forward, MD  carvedilol (COREG) 6.25 MG tablet Take 1 tablet (6.25 mg total) by mouth 2 (two) times daily with a meal. Patient taking differently: Take 12.5 mg by mouth 2 (two) times daily with a meal.  04/23/13   Charolette Forward, MD  cephALEXin (KEFLEX) 500 MG capsule Take 1 capsule (500 mg total) by mouth 3 (three) times daily. 08/23/16 08/30/16  Cristian Bruce, MD  mirabegron ER (MYRBETRIQ) 25 MG TB24 tablet Take 25 mg by mouth daily.  Historical Provider, MD  nitroGLYCERIN (NITROSTAT) 0.4 MG SL tablet Place 1 tablet (0.4 mg total) under the tongue every 5 (five) minutes x 3 doses as needed for chest pain. 04/23/13   Charolette Forward, MD  omeprazole (PRILOSEC) 20 MG capsule Take 20 mg by mouth daily.    Historical Provider, MD  ramipril (ALTACE) 5 MG capsule Take 1 capsule (5 mg total) by mouth daily. Patient taking differently: Take 10 mg by mouth daily.  02/06/15   Charolette Forward, MD  warfarin (COUMADIN) 3 MG tablet Take 1 tablet (3 mg total) by mouth daily at 6 PM. Patient not taking: Reported on 08/12/2016 02/06/15   Charolette Forward, MD  warfarin (COUMADIN) 4 MG tablet Take 4 mg by mouth daily.    Historical Provider, MD    Family History No family history on file.  Social History Social History  Substance Use Topics  . Smoking status: Former Smoker    Quit date: 06/01/1991  . Smokeless tobacco: Never Used  . Alcohol use No     Allergies   Patient has no known allergies.   Review of Systems Review of Systems  Constitutional: Negative for chills, fatigue and fever.  HENT: Negative for congestion and rhinorrhea.   Eyes: Negative for visual disturbance.  Respiratory: Negative for cough, shortness of breath and wheezing.   Cardiovascular: Negative for chest pain and leg swelling.  Gastrointestinal: Negative for abdominal pain, diarrhea, nausea and vomiting.  Genitourinary: Negative for dysuria and flank pain.  Musculoskeletal: Negative for neck pain and neck stiffness.  Skin: Negative for rash and wound.  Allergic/Immunologic: Negative for immunocompromised state.  Neurological: Negative for dizziness, syncope, weakness, numbness and headaches.  All other systems reviewed and are negative.    Physical Exam Updated Vital Signs BP 137/86   Pulse 78   Temp 98.4 F (36.9 C) (Oral)   Resp (!) 24   Ht 5\' 11"  (1.803 m)   SpO2 100%   Physical Exam  Constitutional: He appears well-developed and well-nourished. No distress.  HENT:  Head: Normocephalic and atraumatic.  Mouth/Throat: Oropharynx is clear and moist.  Eyes: Conjunctivae are normal.  Neck: Neck supple.  Cardiovascular: Normal rate, regular rhythm and normal heart sounds.  Exam reveals no friction rub.   No murmur heard. Pulmonary/Chest: Effort normal and breath sounds normal. No respiratory distress. He has no wheezes. He has no rales.  Abdominal: Soft. He exhibits no distension.  Musculoskeletal: He exhibits no edema.  S/p AKA. Stump c/d/I. No deformity or TTP. No sacral decubitus ulcers.  Neurological: He is alert. He exhibits  normal muscle tone.  Oriented to person, place and time. No facial asymmetry. MAE with 5/5 strength. Normal FTN. Endorses diminished but intact sensation to light touch in b/l UE and LE. Speech is normal. No tremor. Unable to assess gait 2/2 AKA.  Skin: Skin is warm. Capillary refill takes less than 2 seconds.  Psychiatric: He has a normal mood and affect.  Nursing note and vitals reviewed.    ED Treatments / Results   DIAGNOSTIC STUDIES: Oxygen Saturation is 100% on RA, normal by my interpretation.    COORDINATION OF CARE: 4:58 PM Discussed treatment plan with pt at bedside which includes lab work and pt agreed to plan.    Labs (all labs ordered are listed, but only abnormal results are displayed) Labs Reviewed  CBC WITH DIFFERENTIAL/PLATELET - Abnormal; Notable for the following:       Result Value   RDW 16.7 (*)  All other components within normal limits  BASIC METABOLIC PANEL - Abnormal; Notable for the following:    Glucose, Bld 136 (*)    Creatinine, Ser 1.44 (*)    GFR calc non Af Amer 43 (*)    GFR calc Af Amer 50 (*)    All other components within normal limits  URINALYSIS, ROUTINE W REFLEX MICROSCOPIC - Abnormal; Notable for the following:    APPearance HAZY (*)    Hgb urine dipstick SMALL (*)    Leukocytes, UA SMALL (*)    Bacteria, UA MANY (*)    All other components within normal limits  PROTIME-INR - Abnormal; Notable for the following:    Prothrombin Time 35.2 (*)    All other components within normal limits  URINE CULTURE  I-STAT TROPOININ, ED    EKG  EKG Interpretation  Date/Time:  Monday August 23 2016 19:08:04 EDT Ventricular Rate:  72 PR Interval:    QRS Duration: 115 QT Interval:  420 QTC Calculation: 460 R Axis:   -47 Text Interpretation:  Atrial fibrillation Left anterior fascicular block Anteroseptal infarct, age indeterminate Baseline wander in lead(s) V2 Diffuse TWI, worse in lateral leads, likely due to repolarization from LVH and LAFB  No acute changes since last tracing Confirmed by Carrah Eppolito MD, [place] [phone]) on 08/23/2016 7:11:37 PM       Radiology Dg Chest 2 View  Result Date: 08/23/2016 CLINICAL DATA:  Syncopal episode. EXAM: CHEST  2 VIEW COMPARISON:  02/03/2015 FINDINGS: The heart is enlarged but relatively stable. There is moderate tortuosity and mild calcification of the thoracic aorta. The lungs are clear. No pleural effusion. The bony thorax is intact. IMPRESSION: Cardiac enlargement but no acute pulmonary findings. Electronically Signed   By: Marijo Sanes M.D.   On: 08/23/2016 17:39   Ct Head Wo Contrast  Result Date: 08/23/2016 CLINICAL DATA:  83 year old male with elevated blood pressure. Atrial fibrillation. Mental status changes. Initial encounter. EXAM: CT HEAD WITHOUT CONTRAST TECHNIQUE: Contiguous axial images were obtained from the base of the skull through the vertex without intravenous contrast. COMPARISON:  08/12/2016 CT. FINDINGS: Brain: No intracranial hemorrhage or CT evidence of large acute infarct. Remote bilateral basal ganglia infarcts. Prominent chronic microvascular changes. Moderate global atrophy without hydrocephalus. No intracranial mass lesion noted on this unenhanced exam. Vascular: Vascular calcifications. Skull: No acute abnormality. Sinuses/Orbits: Post lens replacement. Exophthalmos. No acute orbital abnormality. Minimal mucosal thickening ethmoid sinus air cells. Minimal partial opacification inferior right mastoid air cells. Other: Negative IMPRESSION: No acute intracranial abnormality. Remote basal ganglia infarcts. Prominent chronic microvascular changes. Moderate global atrophy. Electronically Signed   By: Genia Del M.D.   On: 08/23/2016 18:10    Procedures Procedures (including critical care time)  Medications Ordered in ED Medications  cephALEXin (KEFLEX) capsule 500 mg (500 mg Oral Given 08/23/16 2009)  amLODipine (NORVASC) tablet 5 mg (5 mg Oral Given 08/23/16 2136)    ramipril (ALTACE) capsule 5 mg (5 mg Oral Given 08/23/16 2135)     Initial Impression / Assessment and Plan / ED Course  I have reviewed the triage vital signs and the nursing notes.  Pertinent labs & imaging results that were available during my care of the patient were reviewed by me and considered in my medical decision making (see chart for details).     83 yo M with extensive PMHx as above here with asymptomatic HTN. Neurologically, pt is at baseline. Denies any CP, SOB, or signs of HTN urgency ro emergency. EKG  non-ischemic, trop negative, and labs at baseline w/o evidence of end-organ dysfunction. No bleed on CT imaging. Recent INR therapeutic. I suspect his HTN is 2/2 ongoing chronic issues which do not appear acutely changed. I discussed labs, imaging with Dr. Terrence Dupont, pt's PCP - will add on amlodipine 5 mg, have pt f/u in 2 days in clinic.  Otherwise, he does incidentally have +UA. He has h/o recurrent UTIs. No fever, leukocytosis, or signs of pyelo or systemic illness/sepsis. Will tx with PO keflex. Return precautions given.  Final Clinical Impressions(s) / ED Diagnoses   Final diagnoses:  Asymptomatic hypertension  Lower urinary tract infection    I personally performed the services described in this documentation, which was scribed in my presence. The recorded information has been reviewed and is accurate.    Cristian Bruce, MD 08/24/16 [phone]

## 2016-08-23 NOTE — ED Provider Notes (Signed)
Care assumed from previous provider Dr. Ellender Hose. Please see note for further details.  Briefly, patient presented with asymptomatic hypertension. Followed by cardiology, Dr. Terrence Dupont, who was consulted. BP elevated in ED today and meds given prior to arrival. Case discussed, plan agreed upon. INR pending - will follow up. Will also monitor BP. Per Dr. Ellender Hose if BP < 180/<100 and therapeutic INR, safe for discharge to home.   INR 3.41  BP 137/86  Patient with no complaints on re-evaluation. Understands to follow up with cardiology.    [facility] Cristian Vandewater, PA-C 08/23/16 2346    Duffy Bruce, MD 08/24/16 [phone]

## 2016-08-23 NOTE — ED Notes (Signed)
Name called for triage - in restroom

## 2016-08-27 LAB — URINE CULTURE: Culture: >=100000 — AB

## 2017-06-18 IMAGING — CT CT HEAD W/O CM
3 of 4 series · 15 of 47 positions shown, 18 images · non-contrast
Comparison: 08/12/2016 CT.

CLINICAL DATA: 85-year-old male with elevated blood pressure.
Atrial fibrillation. Mental status changes. Initial encounter.

EXAM:
CT HEAD WITHOUT CONTRAST
TECHNIQUE: Contiguous axial images were obtained from the base of the skull
through the vertex without intravenous contrast.

[Series 6: head 2.0 h70h · axial · 0.42mm/px · z∈[+995,+1119]mm · 9 of 78 slices shown, 12 images]
[im 8/78  brain]
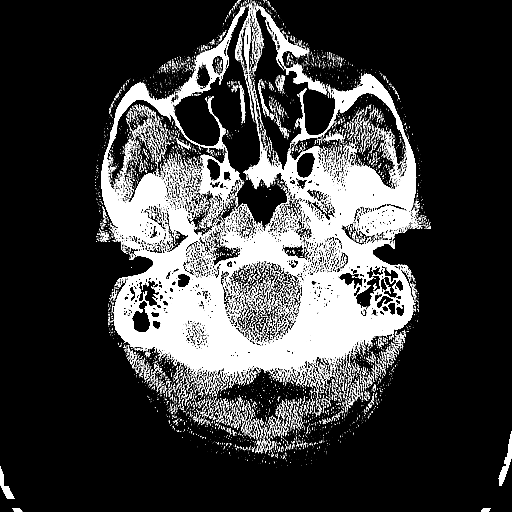
[im 8/78  bone]
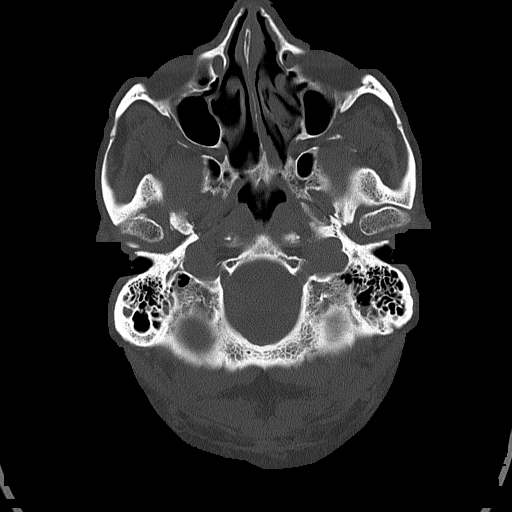
[im 16/78  brain]
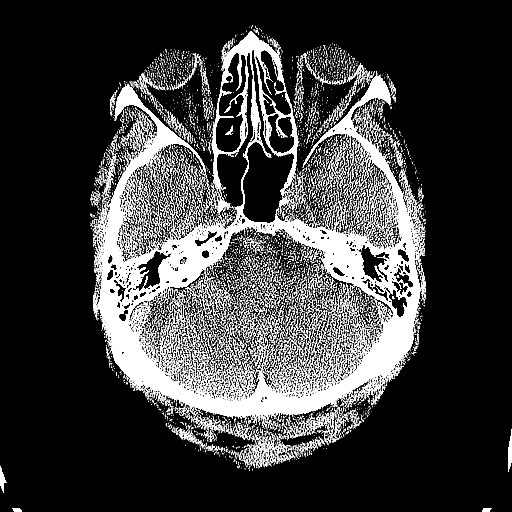
[im 24/78  brain]
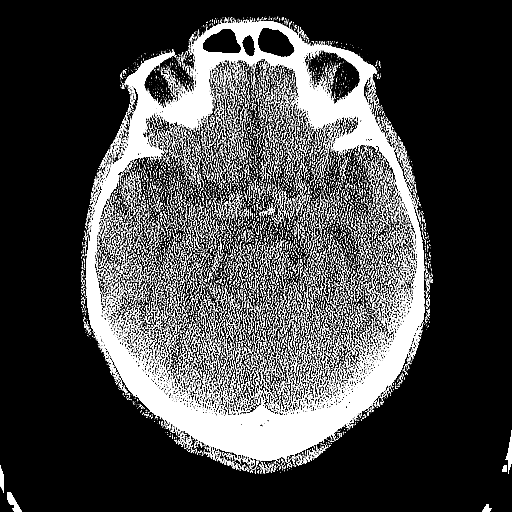
[im 31/78  brain]
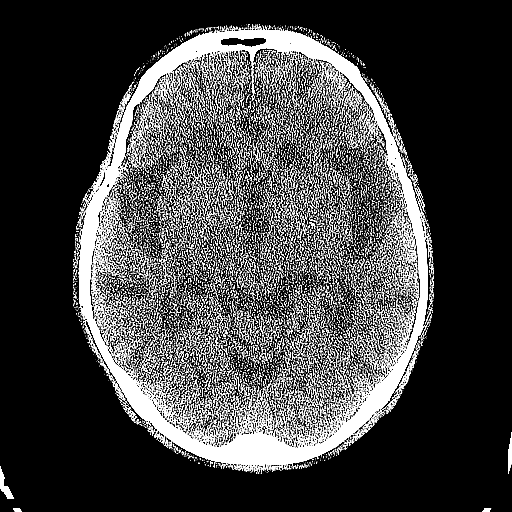
[im 39/78  brain]
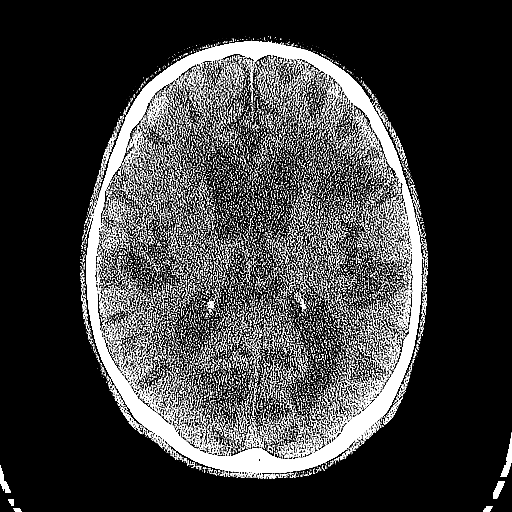
[im 39/78  bone]
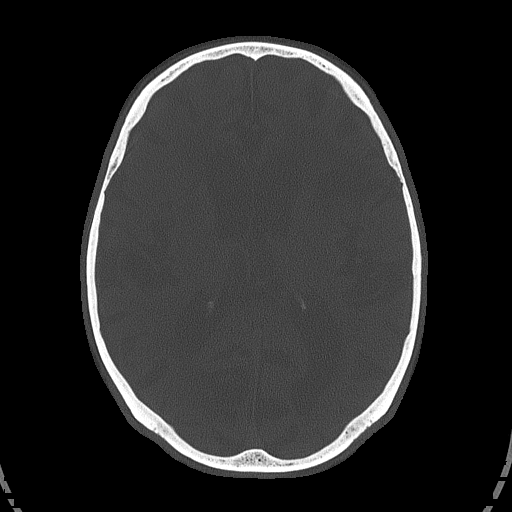
[im 47/78  brain]
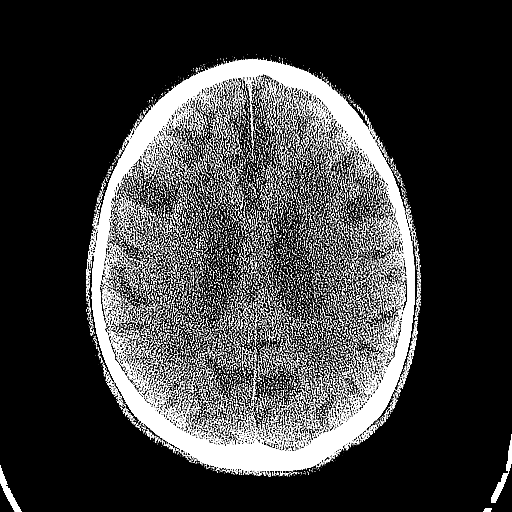
[im 54/78  brain]
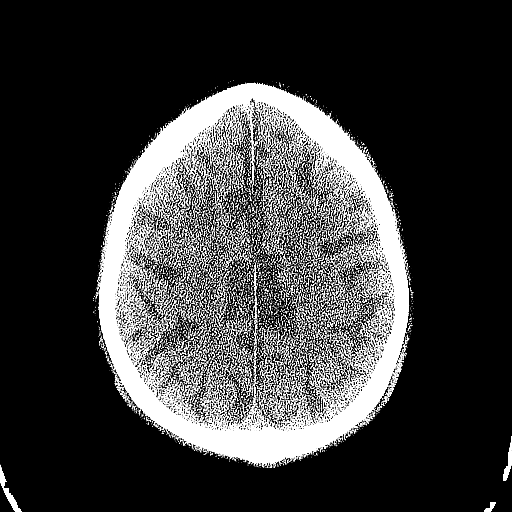
[im 62/78  brain]
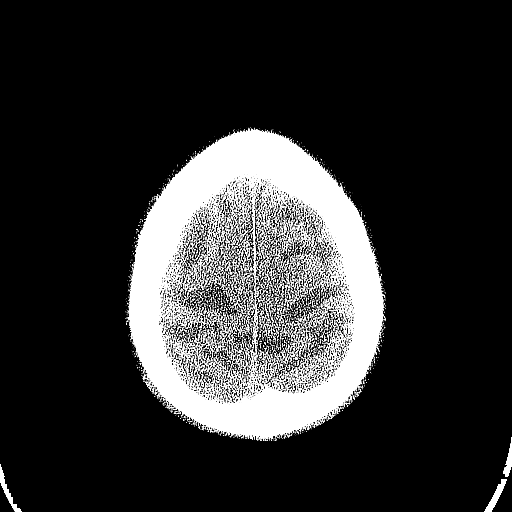
[im 70/78  brain]
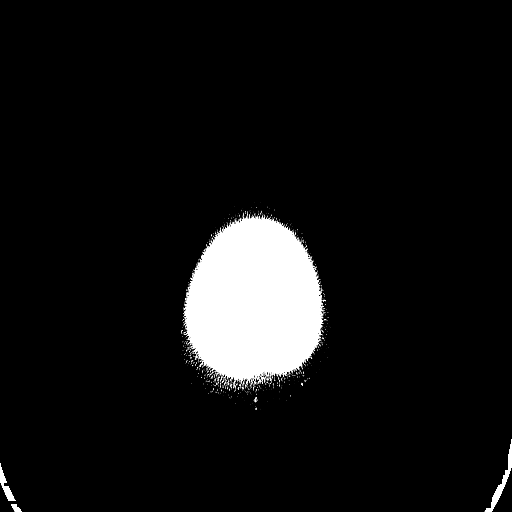
[im 70/78  bone]
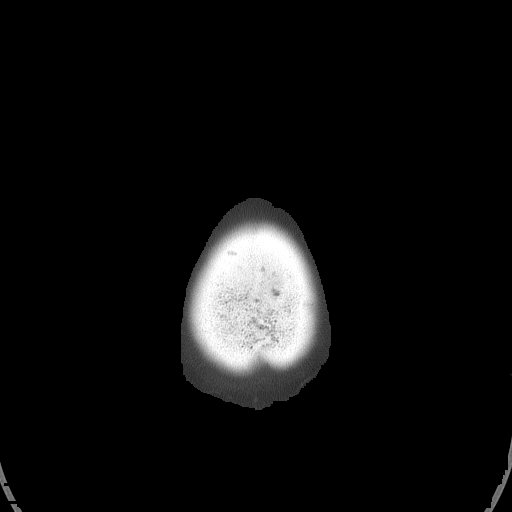

[Series 7: head 3.0 mpr cor · coronal · 0.30mm/px · 3 of 68 slices shown]
[im 23/68  brain]
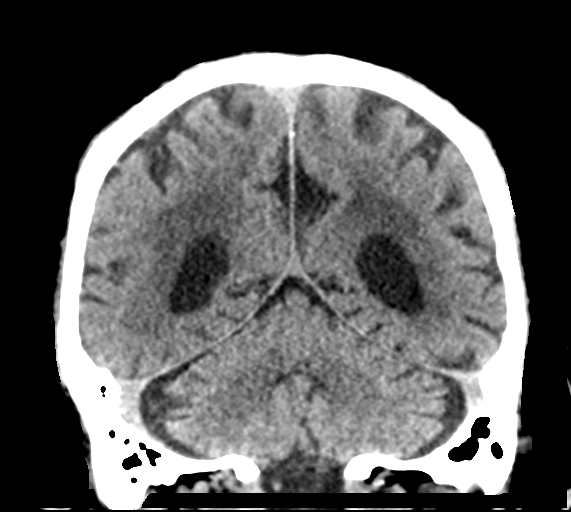
[im 30/68  brain]
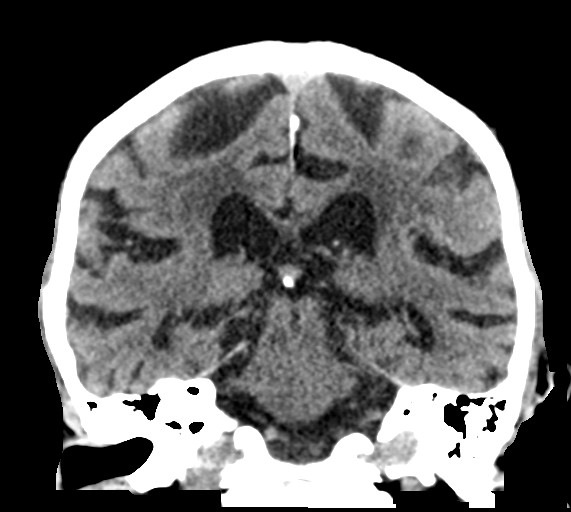
[im 38/68  brain]
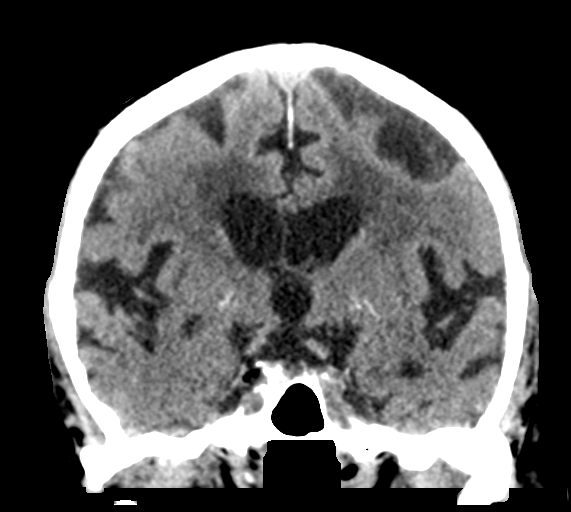

[Series 8: head 3.0 mpr sag · sagittal · 0.30mm/px · 3 of 58 slices shown]
[im 20/58  brain]
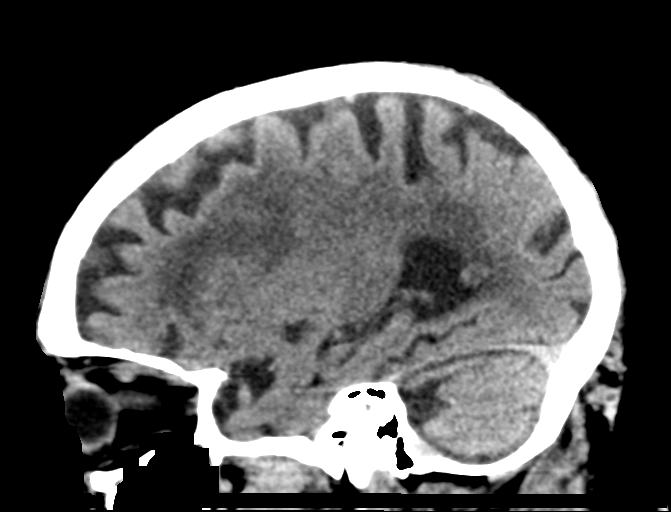
[im 29/58  brain]
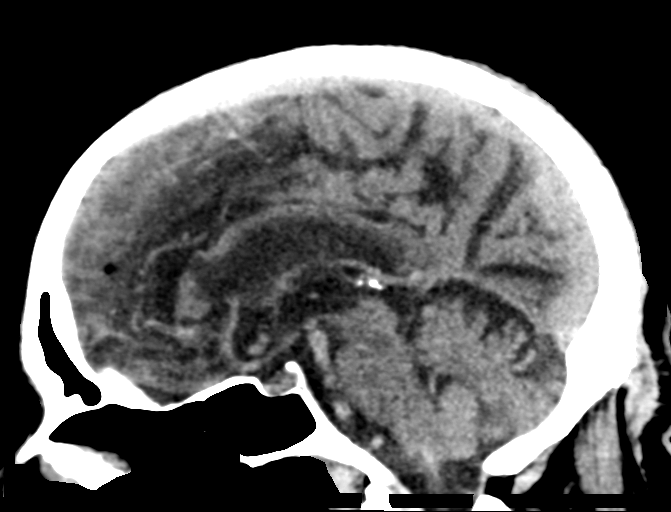
[im 39/58  brain]
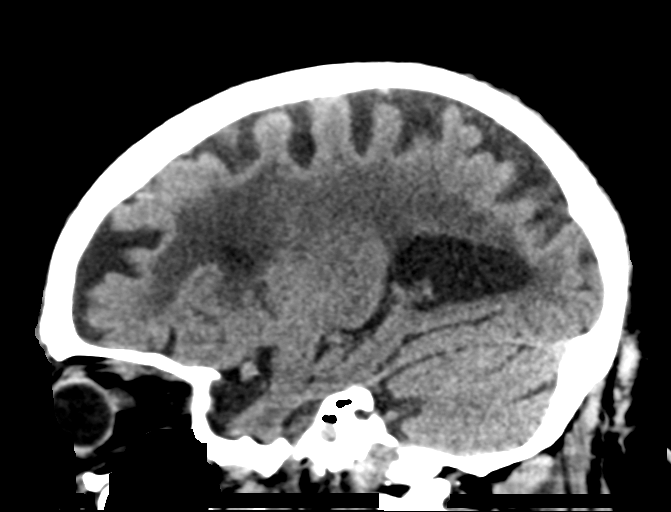

[15 of 47 positions shown; findings below may reference images not displayed]

FINDINGS: Brain: No intracranial hemorrhage or CT evidence of large acute
infarct.

Remote bilateral basal ganglia infarcts.

Prominent chronic microvascular changes.

Moderate global atrophy without hydrocephalus.

No intracranial mass lesion noted on this unenhanced exam.

Vascular: Vascular calcifications.

Skull: No acute abnormality.

Sinuses/Orbits: Post lens replacement. Exophthalmos. No acute
orbital abnormality.

Minimal mucosal thickening ethmoid sinus air cells. Minimal partial
opacification inferior right mastoid air cells.

Other: Negative
IMPRESSION: No acute intracranial abnormality.

Remote basal ganglia infarcts.

Prominent chronic microvascular changes.

Moderate global atrophy.

## 2018-01-29 DEATH — deceased
